# Patient Record
Sex: Male | Born: 1950 | Race: White | Hispanic: No | Marital: Married | Smoking: Former smoker
Health system: Southern US, Community
[De-identification: ages and names within clinical notes are randomized; demographics above are authoritative.]

## PROBLEM LIST (undated history)

## (undated) DIAGNOSIS — N529 Male erectile dysfunction, unspecified: Secondary | ICD-10-CM

## (undated) DIAGNOSIS — H409 Unspecified glaucoma: Secondary | ICD-10-CM

## (undated) DIAGNOSIS — I2510 Atherosclerotic heart disease of native coronary artery without angina pectoris: Secondary | ICD-10-CM

## (undated) DIAGNOSIS — H539 Unspecified visual disturbance: Secondary | ICD-10-CM

## (undated) DIAGNOSIS — G4700 Insomnia, unspecified: Secondary | ICD-10-CM

## (undated) DIAGNOSIS — K219 Gastro-esophageal reflux disease without esophagitis: Secondary | ICD-10-CM

## (undated) DIAGNOSIS — E119 Type 2 diabetes mellitus without complications: Secondary | ICD-10-CM

## (undated) DIAGNOSIS — J309 Allergic rhinitis, unspecified: Secondary | ICD-10-CM

## (undated) DIAGNOSIS — I10 Essential (primary) hypertension: Secondary | ICD-10-CM

## (undated) DIAGNOSIS — J45909 Unspecified asthma, uncomplicated: Secondary | ICD-10-CM

## (undated) DIAGNOSIS — F419 Anxiety disorder, unspecified: Secondary | ICD-10-CM

## (undated) DIAGNOSIS — K635 Polyp of colon: Secondary | ICD-10-CM

## (undated) DIAGNOSIS — I219 Acute myocardial infarction, unspecified: Secondary | ICD-10-CM

## (undated) DIAGNOSIS — D126 Benign neoplasm of colon, unspecified: Secondary | ICD-10-CM

## (undated) DIAGNOSIS — E785 Hyperlipidemia, unspecified: Secondary | ICD-10-CM

## (undated) HISTORY — PX: CARDIAC SURGERY: SHX584

## (undated) HISTORY — PX: CORONARY ARTERY BYPASS GRAFT: SHX141

## (undated) HISTORY — PX: CARDIAC CATHETERIZATION: SHX172

## (undated) HISTORY — PX: CORONARY ANGIOPLASTY: SHX604

---

## 2015-10-31 ENCOUNTER — Ambulatory Visit: Payer: Medicare Other

## 2015-10-31 ENCOUNTER — Ambulatory Visit
Admission: EM | Admit: 2015-10-31 | Discharge: 2015-10-31 | Disposition: A | Discharge status: Home / Self Care | Payer: Medicare Other | Attending: Family Medicine | Admitting: Family Medicine

## 2015-10-31 ENCOUNTER — Encounter: Payer: Self-pay | Admitting: Emergency Medicine

## 2015-10-31 DIAGNOSIS — J029 Acute pharyngitis, unspecified: Secondary | ICD-10-CM | POA: Insufficient documentation

## 2015-10-31 DIAGNOSIS — R05 Cough: Secondary | ICD-10-CM | POA: Diagnosis present

## 2015-10-31 DIAGNOSIS — Z951 Presence of aortocoronary bypass graft: Secondary | ICD-10-CM | POA: Diagnosis not present

## 2015-10-31 DIAGNOSIS — J40 Bronchitis, not specified as acute or chronic: Secondary | ICD-10-CM

## 2015-10-31 HISTORY — DX: Type 2 diabetes mellitus without complications: E11.9

## 2015-10-31 HISTORY — DX: Acute myocardial infarction, unspecified: I21.9

## 2015-10-31 MED ORDER — AZITHROMYCIN 250 MG PO TABS: ORAL_TABLET | ORAL | Status: DC

## 2015-10-31 MED ORDER — HYDROCOD POLST-CPM POLST ER 10-8 MG/5ML PO SUER: 5.0000 mL | Freq: Two times a day (BID) | ORAL | Status: DC | PRN

## 2015-10-31 NOTE — ED Notes (Signed)
Pt reports moved here from [place] about 1 month ago and about 1 week ago started having sore throat and productive cough. Pt reports chills and had fever Saturday but denies any since

## 2015-10-31 NOTE — ED Provider Notes (Signed)
CSN: [id number]     Arrival date & time 10/31/15  [id number] History   First MD Initiated Contact with Patient 10/31/15 1218      .Nurses notes were reviewed. Chief Complaint  Patient presents with  . Sore Throat    Patient reports doing sick Thursday probable sore throat persistent cough which started on Friday. He states cough is gotten worse the sore throats got better. Occasionally he does cough up something green but the cough. Much nonproductive he has no some proximal spasm but only after prolonged coughing.  He has history of diabetes myocardial infarction and cardiac surgery. Stop smoking over 35 years ago and even then was a very light smoker before he stop smoking    (Consider location/radiation/quality/duration/timing/severity/associated sxs/prior Treatment) Patient is a 64 y.o. male presenting with pharyngitis and cough. The history is provided by the patient. No language interpreter was used.  Sore Throat This is a new problem. The current episode started more than 2 days ago. The problem occurs constantly. The problem has been gradually improving. Pertinent negatives include no chest pain, no abdominal pain, no headaches and no shortness of breath. Nothing aggravates the symptoms. Nothing relieves the symptoms. He has tried nothing for the symptoms.  Cough Cough characteristics:  Productive and non-productive Sputum characteristics:  Green Severity:  Moderate Onset quality:  Sudden Timing:  Sporadic Progression:  Worsening Chronicity:  New Context: smoke exposure and upper respiratory infection   Context: not animal exposure, not exposure to allergens, not fumes, not occupational exposure and not sick contacts   Relieved by:  Nothing Ineffective treatments:  None tried Associated symptoms: no chest pain, no ear fullness, no ear pain, no headaches, no rash, no rhinorrhea, no shortness of breath and no sinus congestion   Risk factors: no chemical exposure, no recent infection  and no recent travel     Past Medical History  Diagnosis Date  . Diabetes mellitus without complication ([place])   . Myocardial infarct [facility])    Past Surgical History  Procedure Laterality Date  . Cardiac surgery     History reviewed. No pertinent family history. Social History  Substance Use Topics  . Smoking status: Never Smoker   . Smokeless tobacco: None  . Alcohol Use: Yes     Comment: occasional     Review of Systems  HENT: Negative for ear pain and rhinorrhea.   Respiratory: Positive for cough. Negative for shortness of breath.   Cardiovascular: Negative for chest pain.  Gastrointestinal: Negative for abdominal pain.  Skin: Negative for rash.  Neurological: Negative for headaches.  All other systems reviewed and are negative.   Allergies  Review of patient's allergies indicates no known allergies.  Home Medications   Prior to Admission medications   Medication Sig Start Date End Date Taking? Authorizing Provider  atorvastatin (LIPITOR) 80 MG tablet Take 80 mg by mouth daily.   Yes Historical Provider, MD  carvedilol (COREG) 12.5 MG tablet Take 12.5 mg by mouth 2 (two) times daily with a meal.   Yes Historical Provider, MD  clopidogrel (PLAVIX) 75 MG tablet Take 75 mg by mouth daily.   Yes Historical Provider, MD  glipiZIDE (GLUCOTROL) 10 MG tablet Take 10 mg by mouth daily before breakfast.   Yes Historical Provider, MD  lisinopril (PRINIVIL,ZESTRIL) 10 MG tablet Take 10 mg by mouth daily.   Yes Historical Provider, MD  metFORMIN (GLUMETZA) 1000 MG (MOD) 24 hr tablet Take 1,000 mg by mouth 2 (two) times daily with a meal.  Yes Historical Provider, MD  nitroGLYCERIN (NITROSTAT) 0.4 MG SL tablet Place 0.4 mg under the tongue every 5 (five) minutes as needed for chest pain.   Yes Historical Provider, MD  pantoprazole (PROTONIX) 40 MG tablet Take 40 mg by mouth daily.   Yes Historical Provider, MD   Meds Ordered and Administered this Visit  Medications - No data to  display  BP 132/89 mmHg  Pulse 83  Temp(Src) 98.4 F (36.9 C)  Resp 17  Ht 5\' 6"  (1.676 m)  Wt 194 lb (87.998 kg)  BMI 31.33 kg/m2  SpO2 96% No data found.   Physical Exam  Constitutional: He is oriented to person, place, and time. He appears well-developed and well-nourished.  HENT:  Head: Normocephalic and atraumatic.  Eyes: Conjunctivae are normal. Pupils are equal, round, and reactive to light.  Neck: Normal range of motion. Neck supple. No tracheal deviation present. No thyromegaly present.  Cardiovascular: Normal rate, regular rhythm and normal heart sounds.   Pulmonary/Chest: Effort normal and breath sounds normal. No respiratory distress. He has no wheezes.  Musculoskeletal: Normal range of motion. He exhibits edema.  Lymphadenopathy:    He has no cervical adenopathy.  Neurological: He is alert and oriented to person, place, and time. He has normal reflexes.  Skin: Skin is warm and dry. No erythema.  Psychiatric: He has a normal mood and affect. His behavior is normal.  Vitals reviewed.     ED Course  Procedures (including critical care time)  Labs Review Labs Reviewed - No data to display  Imaging Review Dg Chest 2 View  10/31/2015  CLINICAL DATA:  Productive cough with low-grade fever, congestion, sore throat, fatigue, chills for 5 days. EXAM: CHEST  2 VIEW COMPARISON:  None. FINDINGS: Prior CABG. Heart and mediastinal contours are within normal limits. No focal opacities or effusions. No acute bony abnormality. IMPRESSION: No active cardiopulmonary disease. Electronically Signed   By: Rolm Baptise M.D.   On: 10/31/2015 12:31     Visual Acuity Review  Right Eye Distance:   Left Eye Distance:   Bilateral Distance:    Right Eye Near:   Left Eye Near:    Bilateral Near:        MDM   1. Bronchitis     We'll place on Tussionex 1 teaspoon twice a day. Radiologist agrees is no signs of any infiltrates on chest x-ray will go Z-Pak. Return for follow-up  as needed. Patient declined need for an inhaler at this time.   Frederich Cha, MD 10/31/15 [phone]

## 2015-10-31 NOTE — Discharge Instructions (Signed)
Upper Respiratory Infection, Adult Most upper respiratory infections (URIs) are caused by a virus. A URI affects the nose, throat, and upper air passages. The most common type of URI is often called "the common cold." HOME CARE   Take medicines only as told by your doctor.  Gargle warm saltwater or take cough drops to comfort your throat as told by your doctor.  Use a warm mist humidifier or inhale steam from a shower to increase air moisture. This may make it easier to breathe.  Drink enough fluid to keep your pee (urine) clear or pale yellow.  Eat soups and other clear broths.  Have a healthy diet.  Rest as needed.  Go back to work when your fever is gone or your doctor says it is okay.  You may need to stay home longer to avoid giving your URI to others.  You can also wear a face mask and wash your hands often to prevent spread of the virus.  Use your inhaler more if you have asthma.  Do not use any tobacco products, including cigarettes, chewing tobacco, or electronic cigarettes. If you need help quitting, ask your doctor. GET HELP IF:  You are getting worse, not better.  Your symptoms are not helped by medicine.  You have chills.  You are getting more short of breath.  You have brown or red mucus.  You have yellow or brown discharge from your nose.  You have pain in your face, especially when you bend forward.  You have a fever.  You have puffy (swollen) neck glands.  You have pain while swallowing.  You have white areas in the back of your throat. GET HELP RIGHT AWAY IF:   You have very bad or constant:  Headache.  Ear pain.  Pain in your forehead, behind your eyes, and over your cheekbones (sinus pain).  Chest pain.  You have long-lasting (chronic) lung disease and any of the following:  Wheezing.  Long-lasting cough.  Coughing up blood.  A change in your usual mucus.  You have a stiff neck.  You have changes in  your:  Vision.  Hearing.  Thinking.  Mood. MAKE SURE YOU:   Understand these instructions.  Will watch your condition.  Will get help right away if you are not doing well or get worse.   This information is not intended to replace advice given to you by your health care provider. Make sure you discuss any questions you have with your health care provider.   Document Released: 04/30/2008 Document Revised: 03/29/2015 Document Reviewed: 02/17/2014 Elsevier Interactive Patient Education 2016 [employer] Acute Bronchitis Bronchitis is inflammation of the airways that extend from the windpipe into the lungs (bronchi). The inflammation often causes mucus to develop. This leads to a cough, which is the most common symptom of bronchitis.  In acute bronchitis, the condition usually develops suddenly and goes away over time, usually in a couple weeks. Smoking, allergies, and asthma can make bronchitis worse. Repeated episodes of bronchitis may cause further lung problems.  CAUSES Acute bronchitis is most often caused by the same virus that causes a cold. The virus can spread from person to person (contagious) through coughing, sneezing, and touching contaminated objects. SIGNS AND SYMPTOMS   Cough.   Fever.   Coughing up mucus.   Body aches.   Chest congestion.   Chills.   Shortness of breath.   Sore throat.  DIAGNOSIS  Acute bronchitis is usually diagnosed through a physical exam. Your  health care provider will also ask you questions about your medical history. Tests, such as chest X-rays, are sometimes done to rule out other conditions.  TREATMENT  Acute bronchitis usually goes away in a couple weeks. Oftentimes, no medical treatment is necessary. Medicines are sometimes given for relief of fever or cough. Antibiotic medicines are usually not needed but may be prescribed in certain situations. In some cases, an inhaler may be recommended to help reduce shortness of  breath and control the cough. A cool mist vaporizer may also be used to help thin bronchial secretions and make it easier to clear the chest.  HOME CARE INSTRUCTIONS  Get plenty of rest.   Drink enough fluids to keep your urine clear or pale yellow (unless you have a medical condition that requires fluid restriction). Increasing fluids may help thin your respiratory secretions (sputum) and reduce chest congestion, and it will prevent dehydration.   Take medicines only as directed by your health care provider.  If you were prescribed an antibiotic medicine, finish it all even if you start to feel better.  Avoid smoking and secondhand smoke. Exposure to cigarette smoke or irritating chemicals will make bronchitis worse. If you are a smoker, consider using nicotine gum or skin patches to help control withdrawal symptoms. Quitting smoking will help your lungs heal faster.   Reduce the chances of another bout of acute bronchitis by washing your hands frequently, avoiding people with cold symptoms, and trying not to touch your hands to your mouth, nose, or eyes.   Keep all follow-up visits as directed by your health care provider.  SEEK MEDICAL CARE IF: Your symptoms do not improve after 1 week of treatment.  SEEK IMMEDIATE MEDICAL CARE IF:  You develop an increased fever or chills.   You have chest pain.   You have severe shortness of breath.  You have bloody sputum.   You develop dehydration.  You faint or repeatedly feel like you are going to pass out.  You develop repeated vomiting.  You develop a severe headache. MAKE SURE YOU:   Understand these instructions.  Will watch your condition.  Will get help right away if you are not doing well or get worse.   This information is not intended to replace advice given to you by your health care provider. Make sure you discuss any questions you have with your health care provider.   Document Released: 12/20/2004 Document  Revised: 12/03/2014 Document Reviewed: 05/05/2013 Elsevier Interactive Patient Education [employer].

## 2016-01-25 DIAGNOSIS — E1159 Type 2 diabetes mellitus with other circulatory complications: Secondary | ICD-10-CM | POA: Insufficient documentation

## 2016-01-25 DIAGNOSIS — I10 Essential (primary) hypertension: Secondary | ICD-10-CM | POA: Insufficient documentation

## 2016-01-25 DIAGNOSIS — I25119 Atherosclerotic heart disease of native coronary artery with unspecified angina pectoris: Secondary | ICD-10-CM | POA: Insufficient documentation

## 2016-01-25 DIAGNOSIS — K2100 Gastro-esophageal reflux disease with esophagitis, without bleeding: Secondary | ICD-10-CM | POA: Insufficient documentation

## 2016-01-25 DIAGNOSIS — G4700 Insomnia, unspecified: Secondary | ICD-10-CM | POA: Insufficient documentation

## 2016-02-01 DIAGNOSIS — E782 Mixed hyperlipidemia: Secondary | ICD-10-CM | POA: Insufficient documentation

## 2016-04-27 DIAGNOSIS — N521 Erectile dysfunction due to diseases classified elsewhere: Secondary | ICD-10-CM | POA: Insufficient documentation

## 2016-08-06 DIAGNOSIS — H539 Unspecified visual disturbance: Secondary | ICD-10-CM | POA: Insufficient documentation

## 2016-12-13 ENCOUNTER — Ambulatory Visit: Admission: RE | Admit: 2016-12-13 | Payer: Medicare Other | Source: Ambulatory Visit | Admitting: Gastroenterology

## 2016-12-13 ENCOUNTER — Encounter: Admission: RE | Payer: Self-pay | Source: Ambulatory Visit

## 2016-12-13 SURGERY — COLONOSCOPY WITH PROPOFOL
Anesthesia: General

## 2017-01-10 ENCOUNTER — Encounter: Payer: Self-pay | Admitting: *Deleted

## 2017-01-11 ENCOUNTER — Encounter: Payer: Self-pay | Admitting: Anesthesiology

## 2017-01-11 ENCOUNTER — Encounter
Admission: RE | Disposition: A | Discharge status: Home / Self Care | Payer: Self-pay | Source: Ambulatory Visit | Attending: Gastroenterology

## 2017-01-11 ENCOUNTER — Ambulatory Visit
Admission: RE | Admit: 2017-01-11 | Discharge: 2017-01-11 | Disposition: A | Discharge status: Home / Self Care | Payer: Medicare Other | Source: Ambulatory Visit | Attending: Gastroenterology | Admitting: Gastroenterology

## 2017-01-11 ENCOUNTER — Ambulatory Visit: Payer: Medicare Other | Admitting: Anesthesiology

## 2017-01-11 DIAGNOSIS — I252 Old myocardial infarction: Secondary | ICD-10-CM | POA: Insufficient documentation

## 2017-01-11 DIAGNOSIS — Z7982 Long term (current) use of aspirin: Secondary | ICD-10-CM | POA: Insufficient documentation

## 2017-01-11 DIAGNOSIS — Z7902 Long term (current) use of antithrombotics/antiplatelets: Secondary | ICD-10-CM | POA: Insufficient documentation

## 2017-01-11 DIAGNOSIS — D125 Benign neoplasm of sigmoid colon: Secondary | ICD-10-CM | POA: Diagnosis not present

## 2017-01-11 DIAGNOSIS — Z87891 Personal history of nicotine dependence: Secondary | ICD-10-CM | POA: Diagnosis not present

## 2017-01-11 DIAGNOSIS — D123 Benign neoplasm of transverse colon: Secondary | ICD-10-CM | POA: Insufficient documentation

## 2017-01-11 DIAGNOSIS — D128 Benign neoplasm of rectum: Secondary | ICD-10-CM | POA: Insufficient documentation

## 2017-01-11 DIAGNOSIS — Z7984 Long term (current) use of oral hypoglycemic drugs: Secondary | ICD-10-CM | POA: Diagnosis not present

## 2017-01-11 DIAGNOSIS — Z1211 Encounter for screening for malignant neoplasm of colon: Secondary | ICD-10-CM | POA: Insufficient documentation

## 2017-01-11 DIAGNOSIS — E785 Hyperlipidemia, unspecified: Secondary | ICD-10-CM | POA: Insufficient documentation

## 2017-01-11 DIAGNOSIS — I10 Essential (primary) hypertension: Secondary | ICD-10-CM | POA: Insufficient documentation

## 2017-01-11 DIAGNOSIS — D124 Benign neoplasm of descending colon: Secondary | ICD-10-CM | POA: Insufficient documentation

## 2017-01-11 DIAGNOSIS — G4700 Insomnia, unspecified: Secondary | ICD-10-CM | POA: Insufficient documentation

## 2017-01-11 DIAGNOSIS — H409 Unspecified glaucoma: Secondary | ICD-10-CM | POA: Diagnosis not present

## 2017-01-11 DIAGNOSIS — Z79899 Other long term (current) drug therapy: Secondary | ICD-10-CM | POA: Insufficient documentation

## 2017-01-11 DIAGNOSIS — K219 Gastro-esophageal reflux disease without esophagitis: Secondary | ICD-10-CM | POA: Insufficient documentation

## 2017-01-11 DIAGNOSIS — K5730 Diverticulosis of large intestine without perforation or abscess without bleeding: Secondary | ICD-10-CM | POA: Insufficient documentation

## 2017-01-11 DIAGNOSIS — E119 Type 2 diabetes mellitus without complications: Secondary | ICD-10-CM | POA: Diagnosis not present

## 2017-01-11 DIAGNOSIS — I2510 Atherosclerotic heart disease of native coronary artery without angina pectoris: Secondary | ICD-10-CM | POA: Diagnosis not present

## 2017-01-11 DIAGNOSIS — F419 Anxiety disorder, unspecified: Secondary | ICD-10-CM | POA: Diagnosis not present

## 2017-01-11 HISTORY — PX: COLONOSCOPY WITH PROPOFOL: SHX5780

## 2017-01-11 HISTORY — DX: Anxiety disorder, unspecified: F41.9

## 2017-01-11 HISTORY — DX: Insomnia, unspecified: G47.00

## 2017-01-11 HISTORY — DX: Essential (primary) hypertension: I10

## 2017-01-11 HISTORY — DX: Hyperlipidemia, unspecified: E78.5

## 2017-01-11 HISTORY — DX: Atherosclerotic heart disease of native coronary artery without angina pectoris: I25.10

## 2017-01-11 HISTORY — DX: Male erectile dysfunction, unspecified: N52.9

## 2017-01-11 HISTORY — DX: Unspecified glaucoma: H40.9

## 2017-01-11 HISTORY — DX: Gastro-esophageal reflux disease without esophagitis: K21.9

## 2017-01-11 LAB — GLUCOSE, CAPILLARY: Glucose-Capillary: 108 mg/dL — ABNORMAL HIGH (ref 65–99)

## 2017-01-11 SURGERY — COLONOSCOPY WITH PROPOFOL
Anesthesia: General

## 2017-01-11 MED ORDER — PHENYLEPHRINE HCL 10 MG/ML IJ SOLN
INTRAMUSCULAR | Status: AC
Start: 2017-01-11 — End: 2017-01-11
  Filled 2017-01-11: qty 1

## 2017-01-11 MED ORDER — PROPOFOL 10 MG/ML IV BOLUS
INTRAVENOUS | Status: DC | PRN
  Administered 2017-01-11: 30 mg via INTRAVENOUS
  Administered 2017-01-11: 90 mg via INTRAVENOUS

## 2017-01-11 MED ORDER — PHENYLEPHRINE 40 MCG/ML (10ML) SYRINGE FOR IV PUSH (FOR BLOOD PRESSURE SUPPORT)
PREFILLED_SYRINGE | INTRAVENOUS | Status: DC | PRN
  Administered 2017-01-11: 100 ug via INTRAVENOUS

## 2017-01-11 MED ORDER — PROPOFOL 500 MG/50ML IV EMUL
INTRAVENOUS | Status: DC | PRN
Start: 2017-01-11 — End: 2017-01-11
  Administered 2017-01-11: 120 ug/kg/min via INTRAVENOUS

## 2017-01-11 MED ORDER — SODIUM CHLORIDE 0.9 % IV SOLN
INTRAVENOUS | Status: DC
  Administered 2017-01-11: 1000 mL via INTRAVENOUS

## 2017-01-11 MED ORDER — SODIUM CHLORIDE 0.9 % IV SOLN
INTRAVENOUS | Status: DC
  Administered 2017-01-11: 08:00:00 via INTRAVENOUS

## 2017-01-11 MED ORDER — PROPOFOL 500 MG/50ML IV EMUL
INTRAVENOUS | Status: AC
  Filled 2017-01-11: qty 50

## 2017-01-11 MED ORDER — PROPOFOL 500 MG/50ML IV EMUL
INTRAVENOUS | Status: AC
Start: 2017-01-11 — End: 2017-01-11
  Filled 2017-01-11: qty 50

## 2017-01-11 NOTE — Transfer of Care (Signed)
Immediate Anesthesia Transfer of Care Note  Patient: Peter Morales  Procedure(s) Performed: Procedure(s): COLONOSCOPY WITH PROPOFOL (N/A)  Patient Location: PACU  Anesthesia Type:General  Level of Consciousness: sedated  Airway & Oxygen Therapy: Patient Spontanous Breathing and Patient connected to nasal cannula oxygen  Post-op Assessment: Report given to RN and Post -op Vital signs reviewed and stable  Post vital signs: Reviewed and stable  Last Vitals:  Vitals:   01/11/17 0710  BP: (!) 164/78  Pulse: 79  Resp: 16  Temp: 36.3 C    Last Pain:  Vitals:   01/11/17 0710  TempSrc: Tympanic         Complications: No apparent anesthesia complications

## 2017-01-11 NOTE — Anesthesia Post-op Follow-up Note (Signed)
Anesthesia QCDR form completed.        

## 2017-01-11 NOTE — Anesthesia Postprocedure Evaluation (Signed)
Anesthesia Post Note  Patient: Peter Morales  Procedure(s) Performed: Procedure(s) (LRB): COLONOSCOPY WITH PROPOFOL (N/A)  Patient location during evaluation: Endoscopy Anesthesia Type: General Level of consciousness: awake and alert Pain management: pain level controlled Vital Signs Assessment: post-procedure vital signs reviewed and stable Respiratory status: spontaneous breathing, nonlabored ventilation, respiratory function stable and patient connected to nasal cannula oxygen Cardiovascular status: blood pressure returned to baseline and stable Postop Assessment: no signs of nausea or vomiting Anesthetic complications: no     Last Vitals:  Vitals:   01/11/17 0910 01/11/17 0920  BP: (!) 146/82 (!) 146/82  Pulse:  75  Resp:  17  Temp:      Last Pain:  Vitals:   01/11/17 0850  TempSrc: Tympanic                 Precious Haws Ceaser Ebeling

## 2017-01-11 NOTE — Anesthesia Preprocedure Evaluation (Signed)
Anesthesia Evaluation  Patient identified by MRN, date of birth, ID band Patient awake    Reviewed: Allergy & Precautions, H&P , NPO status , Patient's Chart, lab work & pertinent test results  Airway Mallampati: III  TM Distance: >3 FB Neck ROM: limited    Dental  (+) Poor Dentition, Chipped, Missing   Pulmonary neg pulmonary ROS, neg shortness of breath, former smoker,    Pulmonary exam normal breath sounds clear to auscultation       Cardiovascular Exercise Tolerance: Good hypertension, (-) angina+ CAD, + Past MI, + Cardiac Stents and + CABG  Normal cardiovascular exam Rhythm:regular Rate:Normal     Neuro/Psych PSYCHIATRIC DISORDERS Anxiety negative neurological ROS     GI/Hepatic Neg liver ROS, GERD  Controlled,  Endo/Other  diabetes, Type 2  Renal/GU negative Renal ROS  negative genitourinary   Musculoskeletal   Abdominal   Peds  Hematology negative hematology ROS (+)   Anesthesia Other Findings Past Medical History: No date: Anxiety No date: Coronary artery disease No date: Diabetes mellitus without complication ([facility]) No date: Erectile dysfunction No date: GERD (gastroesophageal reflux disease) No date: Glaucoma No date: Hyperlipidemia No date: Hypertension No date: Insomnia No date: Myocardial infarct  Past Surgical History: No date: CARDIAC CATHETERIZATION No date: CARDIAC SURGERY No date: CORONARY ARTERY BYPASS GRAFT     Reproductive/Obstetrics negative OB ROS                             Anesthesia Physical Anesthesia Plan  ASA: III  Anesthesia Plan: General   Post-op Pain Management:    Induction:   Airway Management Planned:   Additional Equipment:   Intra-op Plan:   Post-operative Plan:   Informed Consent: I have reviewed the patients History and Physical, chart, labs and discussed the procedure including the risks, benefits and alternatives for the  proposed anesthesia with the patient or authorized representative who has indicated his/her understanding and acceptance.   Dental Advisory Given  Plan Discussed with: Anesthesiologist, CRNA and Surgeon  Anesthesia Plan Comments:         Anesthesia Quick Evaluation

## 2017-01-11 NOTE — H&P (Addendum)
Outpatient short stay form Pre-procedure 01/11/2017 7:27 AM Peter Sails MD  Primary Physician: Dr. Janene Harvey  Reason for visit:  Colonoscopy  History of present illness:  Patient is a 66 year old male presenting today as above. He tolerated his prep well. He takes both Plavix and aspirin due to his cardiac history however has not taken though since last Saturday that being now 6 days.    Current Facility-Administered Medications:  .  0.9 %  sodium chloride infusion, , Intravenous, Continuous, Peter Sails, MD .  0.9 %  sodium chloride infusion, , Intravenous, Continuous, Peter Sails, MD  Prescriptions Prior to Admission  Medication Sig Dispense Refill Last Dose  . albuterol (PROVENTIL HFA;VENTOLIN HFA) 108 (90 Base) MCG/ACT inhaler Inhale 2 puffs into the lungs every 6 (six) hours as needed for wheezing or shortness of breath.     Marland Kitchen aspirin EC 81 MG tablet Take 81 mg by mouth daily.     Marland Kitchen azelastine (ASTELIN) 0.1 % nasal spray Place 1 spray into both nostrils 2 (two) times daily. Use in each nostril as directed     . azelastine (OPTIVAR) 0.05 % ophthalmic solution Place into both eyes 2 (two) times daily.     . carvedilol (COREG) 12.5 MG tablet Take 12.5 mg by mouth 2 (two) times daily with a meal.   01/09/2017 at 0800  . cetirizine (ZYRTEC) 10 MG tablet Take 10 mg by mouth daily.     . clopidogrel (PLAVIX) 75 MG tablet Take 75 mg by mouth daily.   01/05/2017 at Unknown time  . cyclobenzaprine (FLEXERIL) 5 MG tablet Take 5 mg by mouth 3 (three) times daily as needed for muscle spasms.     . Diphenhydramine-APAP 25-500 MG TABS Take 500 mg by mouth every evening.     . fenofibrate (TRICOR) 145 MG tablet Take 145 mg by mouth daily.     Marland Kitchen LORazepam (ATIVAN) 0.5 MG tablet Take 0.5 mg by mouth every 8 (eight) hours. takr 1/2 tab as needed     . [DISCONTINUED] aspirin 325 MG EC tablet Take 325 mg by mouth daily.     Marland Kitchen atorvastatin (LIPITOR) 80 MG tablet Take 80 mg by mouth daily.      Marland Kitchen glipiZIDE (GLUCOTROL) 10 MG tablet Take 10 mg by mouth daily before breakfast.     . lisinopril (PRINIVIL,ZESTRIL) 10 MG tablet Take 10 mg by mouth daily.     . metFORMIN (GLUMETZA) 1000 MG (MOD) 24 hr tablet Take 1,000 mg by mouth 2 (two) times daily with a meal.     . nitroGLYCERIN (NITROSTAT) 0.4 MG SL tablet Place 0.4 mg under the tongue every 5 (five) minutes as needed for chest pain.     . pantoprazole (PROTONIX) 40 MG tablet Take 40 mg by mouth daily.     . [DISCONTINUED] azithromycin (ZITHROMAX Z-PAK) 250 MG tablet Take 2 tablets first day and then 1 po a day for 4 days 6 tablet 0   . [DISCONTINUED] chlorpheniramine-HYDROcodone (TUSSIONEX PENNKINETIC ER) 10-8 MG/5ML SUER Take 5 mLs by mouth every 12 (twelve) hours as needed for cough. 115 mL 0      No Known Allergies   Past Medical History:  Diagnosis Date  . Anxiety   . Coronary artery disease   . Diabetes mellitus without complication ([place])   . Erectile dysfunction   . GERD (gastroesophageal reflux disease)   . Glaucoma   . Hyperlipidemia   . Hypertension   . Insomnia   .  Myocardial infarct     Review of systems:      Physical Exam    Heart and lungs: Regular rate and rhythm without rub or gallop, lungs are bilaterally clear.    HEENT: Normocephalic atraumatic eyes are anicteric    Other:     Pertinant exam for procedure: Soft nontender nondistended bowel sounds positive normoactive    Planned proceedures: Colonoscopy and indicated procedures. I have discussed the risks benefits and complications of procedures to include not limited to bleeding, infection, perforation and the risk of sedation and the patient wishes to proceed.    Peter Sails, MD Gastroenterology 01/11/2017  7:27 AM

## 2017-01-11 NOTE — Op Note (Signed)
[facility] Gastroenterology Patient Name: Peter Morales Procedure Date: 01/11/2017 7:26 AM MRN: [id number] Account #: [id number] Date of Birth: 1951-09-02 Admit Type: Outpatient Age: 66 Room: [facility] ENDO ROOM 3 Gender: Male Note Status: Finalized Procedure:            Colonoscopy Indications:          Screening for colorectal malignant neoplasm Providers:            Lollie Sails, MD Medicines:            Monitored Anesthesia Care Complications:        No immediate complications. Procedure:            Pre-Anesthesia Assessment:                       - ASA Grade Assessment: III - A patient with severe                        systemic disease.                       After obtaining informed consent, the colonoscope was                        passed under direct vision. Throughout the procedure,                        the patient's blood pressure, pulse, and oxygen                        saturations were monitored continuously. The                        Colonoscope was introduced through the anus and                        advanced to the the cecum, identified by appendiceal                        orifice and ileocecal valve. The colonoscopy was                        performed with moderate difficulty due to significant                        looping and a tortuous colon. Successful completion of                        the procedure was aided by changing the patient to a                        supine position, changing the patient to a prone                        position and using manual pressure. The patient                        tolerated the procedure well. The quality of the bowel                        preparation was fair.  Findings:      Two sessile polyps were found in the rectum. The polyps were 2 to 3 mm       in size. These polyps were removed with a cold biopsy forceps. Resection       and retrieval were complete.      A 8 mm polyp was found in  the distal sigmoid colon. The polyp was       semi-pedunculated. The polyp was removed with a cold snare. Resection       and retrieval were complete. To prevent bleeding after the polypectomy,       one hemostatic clip was successfully placed. There was no bleeding at       the end of the maneuver.      A 5 mm polyp was found in the distal descending colon. The polyp was       sessile. The polyp was removed with a cold snare. Resection and       retrieval were complete.      A 5 mm polyp was found in the transverse colon. The polyp was sessile.       The polyp was removed with a cold snare. Resection and retrieval were       complete.      Two sessile polyps were found in the distal transverse colon. The polyps       were 2 to 3 mm in size. These polyps were removed with a cold biopsy       forceps. Resection and retrieval were complete.      A 3 mm polyp was found in the splenic flexure. The polyp was sessile.       The polyp was removed with a cold biopsy forceps. Resection and       retrieval were complete. To prevent bleeding after the polypectomy, one       hemostatic clip was successfully placed. There was no bleeding at the       end of the maneuver.      A few small-mouthed diverticula were found in the sigmoid colon,       descending colon and distal descending colon.      The digital rectal exam was normal. Impression:           - Preparation of the colon was fair.                       - Two 2 to 3 mm polyps in the rectum, removed with a                        cold biopsy forceps. Resected and retrieved.                       - One 8 mm polyp in the distal sigmoid colon, removed                        with a cold snare. Resected and retrieved. Clip was                        placed.                       - One 5 mm polyp in the distal descending colon,  removed with a cold snare. Resected and retrieved.                       - One 5 mm polyp in the  transverse colon, removed with                        a cold snare. Resected and retrieved.                       - Two 2 to 3 mm polyps in the distal transverse colon,                        removed with a cold biopsy forceps. Resected and                        retrieved.                       - One 3 mm polyp at the splenic flexure, removed with a                        cold biopsy forceps. Resected and retrieved. Clip was                        placed.                       - Diverticulosis in the sigmoid colon, in the                        descending colon and in the distal descending colon. Recommendation:       - Discharge patient to home.                       - Full liquid diet today, then advance as tolerated to                        low residue diet for 3 days.                       - Telephone GI clinic for pathology results in 1 week. Procedure Code(s):    --- Professional ---                       [phone], Colonoscopy, flexible; with removal of tumor(s),                        polyp(s), or other lesion(s) by snare technique                       [id number], 77, Colonoscopy, flexible; with biopsy, single                        or multiple Diagnosis Code(s):    --- Professional ---                       Z12.11, Encounter for screening for malignant neoplasm                        of colon  K62.1, Rectal polyp                       D12.3, Benign neoplasm of transverse colon (hepatic                        flexure or splenic flexure)                       D12.5, Benign neoplasm of sigmoid colon                       D12.4, Benign neoplasm of descending colon                       K57.30, Diverticulosis of large intestine without                        perforation or abscess without bleeding CPT copyright 2016 [employer]. All rights reserved. The codes documented in this report are preliminary and upon coder review may  be revised to meet  current compliance requirements. Lollie Sails, MD 01/11/2017 8:58:49 AM This report has been signed electronically. Number of Addenda: 0 Note Initiated On: 01/11/2017 7:26 AM Scope Withdrawal Time: 0 hours 41 minutes 19 seconds  Total Procedure Duration: 1 hour 9 minutes 18 seconds       [facility]

## 2017-01-14 ENCOUNTER — Encounter: Payer: Self-pay | Admitting: Gastroenterology

## 2017-01-14 LAB — SURGICAL PATHOLOGY

## 2017-09-09 DIAGNOSIS — J45998 Other asthma: Secondary | ICD-10-CM | POA: Insufficient documentation

## 2017-09-09 DIAGNOSIS — J309 Allergic rhinitis, unspecified: Secondary | ICD-10-CM | POA: Insufficient documentation

## 2017-11-06 DIAGNOSIS — I214 Non-ST elevation (NSTEMI) myocardial infarction: Secondary | ICD-10-CM | POA: Insufficient documentation

## 2017-11-06 DIAGNOSIS — I200 Unstable angina: Secondary | ICD-10-CM | POA: Insufficient documentation

## 2017-12-05 ENCOUNTER — Encounter: Payer: Medicare Other | Attending: Internal Medicine | Admitting: *Deleted

## 2017-12-05 ENCOUNTER — Encounter: Payer: Self-pay | Admitting: *Deleted

## 2017-12-05 VITALS — Ht 66.0 in | Wt 198.5 lb

## 2017-12-05 DIAGNOSIS — I214 Non-ST elevation (NSTEMI) myocardial infarction: Secondary | ICD-10-CM | POA: Insufficient documentation

## 2017-12-05 NOTE — Progress Notes (Signed)
Daily Session Note  Patient Details  Name: Peter Morales MRN: [id number] Date of Birth: 08-05-1951 Referring Provider:     Cardiac Rehab from 12/05/2017 in [facility] Cardiac [facility] Pulmonary [facility]hab  Referring Provider  Tobe Sos      Encounter Date: 12/05/2017  Check In: Session Check In - 12/05/17 1202      Check-In   Location  [facility]-Cardiac & Pulmonary Rehab    Staff Present  Renita Papa, RN Vickki Hearing, BA, ACSM CEP, Exercise Physiologist;Susanne Bice, RN, BSN, CCRP    Supervising physician immediately available to respond to emergencies  See telemetry face sheet for immediately available ER MD    Warm-up and Cool-down  Performed as group-led instruction    Resistance Training Performed  Yes        Exercise Prescription Changes - 12/05/17 1400      Response to Exercise   Blood Pressure (Admit)  118/62    Blood Pressure (Exercise)  150/64    Blood Pressure (Exit)  130/64    Heart Rate (Admit)  80 bpm    Heart Rate (Exercise)  119 bpm    Heart Rate (Exit)  81 bpm    Oxygen Saturation (Admit)  96 %    Oxygen Saturation (Exit)  97 %    Rating of Perceived Exertion (Exercise)  9       Social History   Tobacco Use  Smoking Status Former Smoker  . Last attempt to quit: 07/23/1983  . Years since quitting: 34.3  Smokeless Tobacco Never Used    Goals Met:  Proper associated with RPD/PD & O2 Sat Exercise tolerated well Personal goals reviewed No report of cardiac concerns or symptoms Strength training completed today  Goals Unmet:  Not Applicable  Comments: Med review completed   Dr. Emily Filbert is Medical Director for [facility].

## 2017-12-05 NOTE — Progress Notes (Signed)
Cardiac Individual Treatment Plan  Patient Details  Name: Peter Morales MRN: [id number] Date of Birth: 1951/02/21 Referring Provider:     Cardiac Rehab from 12/05/2017 in [facility] Cardiac and Pulmonary Rehab  Referring Provider  Tobe Sos      Initial Encounter Date:    Cardiac Rehab from 12/05/2017 in [facility] Cardiac and Pulmonary Rehab  Date  12/05/17  Referring Provider  Tobe Sos      Visit Diagnosis: NSTEMI (non-ST elevated myocardial infarction) [facility])  Patient's Home Medications on Admission:  Current Outpatient Medications:  .  albuterol (PROVENTIL HFA;VENTOLIN HFA) 108 (90 Base) MCG/ACT inhaler, Inhale 2 puffs into the lungs every 6 (six) hours as needed for wheezing or shortness of breath., Disp: , Rfl:  .  aspirin EC 81 MG tablet, Take 81 mg by mouth daily., Disp: , Rfl:  .  atorvastatin (LIPITOR) 80 MG tablet, Take 80 mg by mouth daily., Disp: , Rfl:  .  carvedilol (COREG) 12.5 MG tablet, Take 12.5 mg by mouth 2 (two) times daily with a meal., Disp: , Rfl:  .  fluticasone (FLONASE) 50 MCG/ACT nasal spray, Place into the nose., Disp: , Rfl:  .  glipiZIDE (GLUCOTROL) 10 MG tablet, Take 10 mg by mouth daily before breakfast., Disp: , Rfl:  .  isosorbide mononitrate (IMDUR) 30 MG 24 hr tablet, Take by mouth., Disp: , Rfl:  .  ketotifen (ZADITOR) 0.025 % ophthalmic solution, Apply to eye., Disp: , Rfl:  .  LORazepam (ATIVAN) 0.5 MG tablet, Take 0.5 mg by mouth every 8 (eight) hours. takr 1/2 tab as needed, Disp: , Rfl:  .  metFORMIN (GLUMETZA) 1000 MG (MOD) 24 hr tablet, Take 1,000 mg by mouth 2 (two) times daily with a meal., Disp: , Rfl:  .  montelukast (SINGULAIR) 10 MG tablet, Take by mouth., Disp: , Rfl:  .  nitroGLYCERIN (NITROSTAT) 0.4 MG SL tablet, Place 0.4 mg under the tongue every 5 (five) minutes as needed for chest pain., Disp: , Rfl:  .  pantoprazole (PROTONIX) 40 MG tablet, Take 40 mg by mouth daily., Disp: , Rfl:  .  rosuvastatin (CRESTOR) 40 MG tablet, Take by mouth.,  Disp: , Rfl:  .  ticagrelor (BRILINTA) 90 MG TABS tablet, Take by mouth., Disp: , Rfl:  .  azelastine (ASTELIN) 0.1 % nasal spray, Place 1 spray into both nostrils 2 (two) times daily. Use in each nostril as directed, Disp: , Rfl:  .  azelastine (OPTIVAR) 0.05 % ophthalmic solution, Place into both eyes 2 (two) times daily., Disp: , Rfl:  .  cetirizine (ZYRTEC) 10 MG tablet, Take 10 mg by mouth daily., Disp: , Rfl:  .  clopidogrel (PLAVIX) 75 MG tablet, Take 75 mg by mouth daily., Disp: , Rfl:  .  cyclobenzaprine (FLEXERIL) 5 MG tablet, Take 5 mg by mouth 3 (three) times daily as needed for muscle spasms., Disp: , Rfl:  .  Diphenhydramine-APAP 25-500 MG TABS, Take 500 mg by mouth every evening., Disp: , Rfl:  .  fenofibrate (TRICOR) 145 MG tablet, Take 145 mg by mouth daily., Disp: , Rfl:  .  fexofenadine (ALLEGRA) 180 MG tablet, Take by mouth., Disp: , Rfl:  .  lisinopril (PRINIVIL,ZESTRIL) 10 MG tablet, Take 10 mg by mouth daily., Disp: , Rfl:  .  Propylhexedrine (BENZEDREX) INHA, Place into the nose., Disp: , Rfl:   Past Medical History: Past Medical History:  Diagnosis Date  . Anxiety   . Coronary artery disease   . Diabetes mellitus without complication ([place])   .  Erectile dysfunction   . GERD (gastroesophageal reflux disease)   . Glaucoma   . Hyperlipidemia   . Hypertension   . Insomnia   . Myocardial infarct ([facility])     Tobacco Use: Social History   Tobacco Use  Smoking Status Former Smoker  . Last attempt to quit: 07/23/1983  . Years since quitting: 34.3  Smokeless Tobacco Never Used    Labs: Recent Review Flowsheet Data    There is no flowsheet data to display.       Exercise Target Goals: Date: 12/05/17  Exercise Program Goal: Individual exercise prescription set with THRR, safety & activity barriers. Participant demonstrates ability to understand and report RPE using BORG scale, to self-measure pulse accurately, and to acknowledge the importance of the exercise  prescription.  Exercise Prescription Goal: Starting with aerobic activity 30 plus minutes a day, 3 days per week for initial exercise prescription. Provide home exercise prescription and guidelines that participant acknowledges understanding prior to discharge.  Activity Barriers & Risk Stratification: Activity Barriers & Cardiac Risk Stratification - 12/05/17 1440      Activity Barriers & Cardiac Risk Stratification   Activity Barriers  Arthritis;Back Problems;Shortness of Breath;Chest Pain/Angina;Joint Problems    Cardiac Risk Stratification  High       6 Minute Walk: 6 Minute Walk    Row Name 12/05/17 1436         6 Minute Walk   Distance  1220 feet     Walk Time  6 minutes     # of Rest Breaks  0     MPH  2.31     METS  2.9     RPE  9     Perceived Dyspnea   0     VO2 Peak  10.2     Symptoms  No     Resting HR  81 bpm     Resting BP  118/62     Resting Oxygen Saturation   96 %     Exercise Oxygen Saturation  during 6 min walk  97 %     Max Ex. HR  116 bpm     Max Ex. BP  150/64     2 Minute Post BP  130/64        Oxygen Initial Assessment:   Oxygen Re-Evaluation:   Oxygen Discharge (Final Oxygen Re-Evaluation):   Initial Exercise Prescription: Initial Exercise Prescription - 12/05/17 1400      Date of Initial Exercise RX and Referring Provider   Date  12/05/17    Referring Provider  Tobe Sos      Treadmill   MPH  2.3    Grade  0.5    Minutes  15    METs  2.9      REL-XR   Level  3    Speed  50    Minutes  15    METs  2.9      T5 Nustep   Level  2    SPM  80    Minutes  15    METs  2.9      Prescription Details   Frequency (times per week)  3    Duration  Progress to 45 minutes of aerobic exercise without signs/symptoms of physical distress      Intensity   THRR 40-80% of Max Heartrate  109-137    Ratings of Perceived Exertion  11-13    Perceived Dyspnea  0-4      Resistance Training  Training Prescription  Yes    Weight  3 lb     Reps  10-15       Perform Capillary Blood Glucose checks as needed.  Exercise Prescription Changes: Exercise Prescription Changes    Row Name 12/05/17 1400             Response to Exercise   Blood Pressure (Admit)  118/62       Blood Pressure (Exercise)  150/64       Blood Pressure (Exit)  130/64       Heart Rate (Admit)  80 bpm       Heart Rate (Exercise)  119 bpm       Heart Rate (Exit)  81 bpm       Oxygen Saturation (Admit)  96 %       Oxygen Saturation (Exit)  97 %       Rating of Perceived Exertion (Exercise)  9          Exercise Comments:   Exercise Goals and Review: Exercise Goals    Row Name 12/05/17 1435             Exercise Goals   Increase Physical Activity  Yes       Intervention  Provide advice, education, support and counseling about physical activity/exercise needs.;Develop an individualized exercise prescription for aerobic and resistive training based on initial evaluation findings, risk stratification, comorbidities and participant's personal goals.       Expected Outcomes  Achievement of increased cardiorespiratory fitness and enhanced flexibility, muscular endurance and strength shown through measurements of functional capacity and personal statement of participant.       Increase Strength and Stamina  Yes       Intervention  Provide advice, education, support and counseling about physical activity/exercise needs.;Develop an individualized exercise prescription for aerobic and resistive training based on initial evaluation findings, risk stratification, comorbidities and participant's personal goals.       Expected Outcomes  Achievement of increased cardiorespiratory fitness and enhanced flexibility, muscular endurance and strength shown through measurements of functional capacity and personal statement of participant.       Able to understand and use rate of perceived exertion (RPE) scale  Yes       Intervention  Provide education and explanation  on how to use RPE scale       Expected Outcomes  Short Term: Able to use RPE daily in rehab to express subjective intensity level;Long Term:  Able to use RPE to guide intensity level when exercising independently       Able to understand and use Dyspnea scale  Yes       Intervention  Provide education and explanation on how to use Dyspnea scale       Expected Outcomes  Short Term: Able to use Dyspnea scale daily in rehab to express subjective sense of shortness of breath during exertion;Long Term: Able to use Dyspnea scale to guide intensity level when exercising independently       Knowledge and understanding of Target Heart Rate Range (THRR)  Yes       Intervention  Provide education and explanation of THRR including how the numbers were predicted and where they are located for reference       Expected Outcomes  Long Term: Able to use THRR to govern intensity when exercising independently;Short Term: Able to state/look up THRR;Short Term: Able to use daily as guideline for intensity in rehab  Able to check pulse independently  Yes       Intervention  Provide education and demonstration on how to check pulse in carotid and radial arteries.;Review the importance of being able to check your own pulse for safety during independent exercise       Expected Outcomes  Short Term: Able to explain why pulse checking is important during independent exercise;Long Term: Able to check pulse independently and accurately       Understanding of Exercise Prescription  Yes       Intervention  Provide education, explanation, and written materials on patient's individual exercise prescription       Expected Outcomes  Short Term: Able to explain program exercise prescription;Long Term: Able to explain home exercise prescription to exercise independently          Exercise Goals Re-Evaluation :   Discharge Exercise Prescription (Final Exercise Prescription Changes): Exercise Prescription Changes - 12/05/17 1400       Response to Exercise   Blood Pressure (Admit)  118/62    Blood Pressure (Exercise)  150/64    Blood Pressure (Exit)  130/64    Heart Rate (Admit)  80 bpm    Heart Rate (Exercise)  119 bpm    Heart Rate (Exit)  81 bpm    Oxygen Saturation (Admit)  96 %    Oxygen Saturation (Exit)  97 %    Rating of Perceived Exertion (Exercise)  9       Nutrition:  Target Goals: Understanding of nutrition guidelines, daily intake of sodium 1500mg , cholesterol 200mg , calories 30% from fat and 7% or less from saturated fats, daily to have 5 or more servings of fruits and vegetables.  Biometrics: Pre Biometrics - 12/05/17 1434      Pre Biometrics   Height  5\' 6"  (1.676 m)    Weight  198 lb 8 oz (90 kg)    Waist Circumference  42 inches    Hip Circumference  42 inches    Waist to Hip Ratio  1 %    BMI (Calculated)  32.05    Single Leg Stand  20.66 seconds        Nutrition Therapy Plan and Nutrition Goals: Nutrition Therapy & Goals - 12/05/17 1204      Intervention Plan   Intervention  Prescribe, educate and counsel regarding individualized specific dietary modifications aiming towards targeted core components such as weight, hypertension, lipid management, diabetes, heart failure and other comorbidities.    Expected Outcomes  Short Term Goal: Understand basic principles of dietary content, such as calories, fat, sodium, cholesterol and nutrients.;Short Term Goal: A plan has been developed with personal nutrition goals set during dietitian appointment.;Long Term Goal: Adherence to prescribed nutrition plan.       Nutrition Discharge: Rate Your Plate Scores: Nutrition Assessments - 12/05/17 1204      MEDFICTS Scores   Pre Score  24       Nutrition Goals Re-Evaluation:   Nutrition Goals Discharge (Final Nutrition Goals Re-Evaluation):   Psychosocial: Target Goals: Acknowledge presence or absence of significant depression and/or stress, maximize coping skills, provide positive  support system. Participant is able to verbalize types and ability to use techniques and skills needed for reducing stress and depression.   Initial Review & Psychosocial Screening: Initial Psych Review & Screening - 12/05/17 1437      Initial Review   Current issues with  Current Stress Concerns    Source of Stress Concerns  Occupation    Comments  Aragorn's job is very stressful. 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 He knows that this job is very stressful and said each of his heart attacks has happened after a very stressful event so he knows he needs to be careful. He also reported that his wife's health is declining some and he knows his health is not getting better      [place]ack[place]?  Yes wife, daughters here, sons in [place] and [place]      Screening Interventions   Interventions  Yes;Encouraged to exercise    Expected Outcomes  Short Term goal: Utilizing psychosocial counselor, staff and physician to assist with identification of specific Stressors or current issues interfering with healing process. Setting desired goal for each stressor or current issue identified.;Long Term Goal: Stressors or current issues are controlled or eliminated.;Short Term goal: Identification and review with participant of any Quality of Life or Depression concerns found by scoring the questionnaire.;Long Term goal: The participant improves quality of Life and PHQ9 Scores as seen by post scores and/or verbalization of changes       Quality of Life Scores:  Quality of Life - 12/05/17 1204      Quality of Life Scores   Health/Function Pre  18.77 %    Socioeconomic Pre  23.83 %    Psych/Spiritual Pre  24.93 %    Family Pre  27.6 %    GLOBAL Pre  22.33 %       PHQ-9: Recent Review Flowsheet Data    Depression screen [facility] 2/9 12/05/2017   Decreased Interest 1   Down, Depressed, Hopeless 0   PHQ - 2 Score 1   Altered sleeping 3    Tired, decreased energy 2   Change in appetite 1   Feeling bad or failure about yourself  0   Trouble concentrating 0   Moving slowly or fidgety/restless 0   Suicidal thoughts 0   PHQ-9 Score 7   Difficult doing work/chores Somewhat difficult     Interpretation of Total Score  Total Score Depression Severity:  1-4 = Minimal depression, 5-9 = Mild depression, 10-14 = Moderate depression, 15-19 = Moderately severe depression, 20-27 = Severe depression   Psychosocial Evaluation and Intervention:   Psychosocial Re-Evaluation:   Psychosocial Discharge (Final Psychosocial Re-Evaluation):   Vocational Rehabilitation: Provide vocational rehab assistance to qualifying candidates.   Vocational Rehab Evaluation & Intervention: Vocational Rehab - 12/05/17 1439      Initial Vocational Rehab Evaluation & Intervention   Assessment shows need for Vocational Rehabilitation  No       Education: Education Goals: Education classes will be provided on a variety of topics geared toward better understanding of heart health and risk factor modification. Participant will state understanding/return demonstration of topics presented as noted by education test scores.  Learning Barriers/Preferences: Learning Barriers/Preferences - 12/05/17 1439      Learning Barriers/Preferences   Learning Barriers  None    Learning Preferences  None       Education Topics: General Nutrition Guidelines/Fats and Fiber: -Group instruction provided by verbal, written material, models and posters to present the general guidelines for heart healthy nutrition. Gives an explanation and review of dietary fats and fiber.   Controlling Sodium/Reading Food Labels: -Group verbal and written material supporting the discussion of sodium use in heart healthy nutrition. Review and explanation with models, verbal and written materials for utilization of the food  label.   Exercise Physiology & Risk Factors: - Group verbal  and written instruction with models to review the exercise physiology of the cardiovascular system and associated critical values. Details cardiovascular disease risk factors and the goals associated with each risk factor.   Aerobic Exercise & Resistance Training: - Gives group verbal and written discussion on the health impact of inactivity. On the components of aerobic and resistive training programs and the benefits of this training and how to safely progress through these programs.   Flexibility, Balance, General Exercise Guidelines: - Provides group verbal and written instruction on the benefits of flexibility and balance training programs. Provides general exercise guidelines with specific guidelines to those with heart or lung disease. Demonstration and skill practice provided.   Stress Management: - Provides group verbal and written instruction about the health risks of elevated stress, cause of high stress, and healthy ways to reduce stress.   Depression: - Provides group verbal and written instruction on the correlation between heart/lung disease and depressed mood, treatment options, and the stigmas associated with seeking treatment.   Anatomy & Physiology of the Heart: - Group verbal and written instruction and models provide basic cardiac anatomy and physiology, with the coronary electrical and arterial systems. Review of: AMI, Angina, Valve disease, Heart Failure, Cardiac Arrhythmia, Pacemakers, and the ICD.   Cardiac Procedures: - Group verbal and written instruction to review commonly prescribed medications for heart disease. Reviews the medication, class of the drug, and side effects. Includes the steps to properly store meds and maintain the prescription regimen. (beta blockers and nitrates)   Cardiac Medications I: - Group verbal and written instruction to review commonly prescribed medications for heart disease. Reviews the medication, class of the drug, and side  effects. Includes the steps to properly store meds and maintain the prescription regimen.   Cardiac Medications II: -Group verbal and written instruction to review commonly prescribed medications for heart disease. Reviews the medication, class of the drug, and side effects. (all other drug classes)    Go Sex-Intimacy & Heart Disease, Get SMART - Goal Setting: - Group verbal and written instruction through game format to discuss heart disease and the return to sexual intimacy. Provides group verbal and written material to discuss and apply goal setting through the application of the S.M.A.R.T. Method.   Other Matters of the Heart: - Provides group verbal, written materials and models to describe Heart Failure, Angina, Valve Disease, Peripheral Artery Disease, and Diabetes in the realm of heart disease. Includes description of the disease process and treatment options available to the cardiac patient.   Exercise & Equipment Safety: - Individual verbal instruction and demonstration of equipment use and safety with use of the equipment.   Cardiac Rehab from 12/05/2017 in [facility] Cardiac and Pulmonary Rehab  Date  12/05/17  Educator  [facility]  Instruction Review Code  1- Verbalizes Understanding      Infection Prevention: - Provides verbal and written material to individual with discussion of infection control including proper hand washing and proper equipment cleaning during exercise session.   Cardiac Rehab from 12/05/2017 in [facility] Cardiac and Pulmonary Rehab  Date  12/05/17  Educator  [facility]  Instruction Review Code  1- Verbalizes Understanding      Falls Prevention: - Provides verbal and written material to individual with discussion of falls prevention and safety.   Cardiac Rehab from 12/05/2017 in [facility] Cardiac and Pulmonary Rehab  Date  12/05/17  Educator  [facility]  Instruction Review Code  1-  Verbalizes Understanding      Diabetes: - Individual verbal and written instruction to review  signs/symptoms of diabetes, desired ranges of glucose level fasting, after meals and with exercise. Acknowledge that pre and post exercise glucose checks will be done for 3 sessions at entry of program.   Cardiac Rehab from 12/05/2017 in [facility] Cardiac and Pulmonary Rehab  Date  12/05/17  Educator  [facility]  Instruction Review Code  1- Verbalizes Understanding      Other: -Provides group and verbal instruction on various topics (see comments)    Knowledge Questionnaire Score: Knowledge Questionnaire Score - 12/05/17 1207      Knowledge Questionnaire Score   Pre Score  23/28       Core Components/Risk Factors/Patient Goals at Admission: Personal Goals and Risk Factors at Admission - 12/05/17 1433      Core Components/Risk Factors/Patient Goals on Admission    Weight Management  Yes;Obesity;Weight Loss    Intervention  Weight Management: Develop a combined nutrition and exercise program designed to reach desired caloric intake, while maintaining appropriate intake of nutrient and fiber, sodium and fats, and appropriate energy expenditure required for the weight goal.;Weight Management: Provide education and appropriate resources to help participant work on and attain dietary goals.;Weight Management/Obesity: Establish reasonable short term and long term weight goals.;Obesity: Provide education and appropriate resources to help participant work on and attain dietary goals.    Admit Weight  198 lb 8 oz (90 kg)    Goal Weight: Short Term  194 lb (88 kg)    Goal Weight: Long Term  170 lb (77.1 kg)    Expected Outcomes  Short Term: Continue to assess and modify interventions until short term weight is achieved;Long Term: Adherence to nutrition and physical activity/exercise program aimed toward attainment of established weight goal;Weight Loss: Understanding of general recommendations for a balanced deficit meal plan, which promotes 1-2 lb weight loss per week and includes a negative energy balance of  [phone] kcal/d;Understanding recommendations for meals to include 15-35% energy as protein, 25-35% energy from fat, 35-60% energy from carbohydrates, less than 200mg  of dietary cholesterol, 20-35 gm of total fiber daily;Understanding of distribution of calorie intake throughout the day with the consumption of 4-5 meals/snacks    Diabetes  Yes    Intervention  Provide education about signs/symptoms and action to take for hypo/hyperglycemia.;Provide education about proper nutrition, including hydration, and aerobic/resistive exercise prescription along with prescribed medications to achieve blood glucose in normal ranges: Fasting glucose 65-99 mg/dL    Expected Outcomes  Short Term: Participant verbalizes understanding of the signs/symptoms and immediate care of hyper/hypoglycemia, proper foot care and importance of medication, aerobic/resistive exercise and nutrition plan for blood glucose control.;Long Term: Attainment of HbA1C < 7%.    Hypertension  Yes    Intervention  Provide education on lifestyle modifcations including regular physical activity/exercise, weight management, moderate sodium restriction and increased consumption of fresh fruit, vegetables, and low fat dairy, alcohol moderation, and smoking cessation.;Monitor prescription use compliance.    Expected Outcomes  Short Term: Continued assessment and intervention until BP is < 140/83mm HG in hypertensive participants. < 130/63mm HG in hypertensive participants with diabetes, heart failure or chronic kidney disease.;Long Term: Maintenance of blood pressure at goal levels.    Lipids  Yes    Intervention  Provide education and support for participant on nutrition & aerobic/resistive exercise along with prescribed medications to achieve LDL 70mg , HDL >40mg .    Expected Outcomes  Short Term: Participant states understanding of  desired cholesterol values and is compliant with medications prescribed. Participant is following exercise prescription  and nutrition guidelines.;Long Term: Cholesterol controlled with medications as prescribed, with individualized exercise RX and with personalized nutrition plan. Value goals: LDL < 70mg , HDL > 40 mg.    Stress  Yes Corian's job is very stressful. His wife's health is also declining some. His doctor told him 60% of his heart issues are caused by genetics which is discouraging to him.     Intervention  Offer individual and/or small group education and counseling on adjustment to heart disease, stress management and health-related lifestyle change. Teach and support self-help strategies.;Refer participants experiencing significant psychosocial distress to appropriate mental health specialists for further evaluation and treatment. When possible, include family members and significant others in education/counseling sessions.    Expected Outcomes  Short Term: Participant demonstrates changes in health-related behavior, relaxation and other stress management skills, ability to obtain effective social support, and compliance with psychotropic medications if prescribed.;Long Term: Emotional wellbeing is indicated by absence of clinically significant psychosocial distress or social isolation.       Core Components/Risk Factors/Patient Goals Review:    Core Components/Risk Factors/Patient Goals at Discharge (Final Review):    ITP Comments: ITP Comments    Row Name 12/05/17 1202           ITP Comments  medical review completed. ITP sent to Dr Elta Guadeloupe Sabra Heck for review,changes as needed and signature   Documentation of diagnosis can be found in [facility] 11/05/2017 hospital admit          Comments: Initial ITP

## 2017-12-05 NOTE — Patient Instructions (Signed)
Patient Instructions  Patient Details  Name: Peter Morales MRN: [id number] Date of Birth: December 29, 1950 Referring Provider:  Leonia Morales,*  Below are the personal goals you chose as well as exercise and nutrition goals. Our goal is to help you keep on track towards obtaining and maintaining your goals. We will be discussing your progress on these goals with you throughout the program.  Initial Exercise Prescription: Initial Exercise Prescription - 12/05/17 1400      Date of Initial Exercise RX and Referring Provider   Date  12/05/17    Referring Provider  Peter Morales      Treadmill   MPH  2.3    Grade  0.5    Minutes  15    METs  2.9      REL-XR   Level  3    Speed  50    Minutes  15    METs  2.9      T5 Nustep   Level  2    SPM  80    Minutes  15    METs  2.9      Prescription Details   Frequency (times per week)  3    Duration  Progress to 45 minutes of aerobic exercise without signs/symptoms of physical distress      Intensity   THRR 40-80% of Max Heartrate  109-137    Ratings of Perceived Exertion  11-13    Perceived Dyspnea  0-4      Resistance Training   Training Prescription  Yes    Weight  3 lb    Reps  10-15       Exercise Goals: Frequency: Be able to perform aerobic exercise three times per week working toward 3-5 days per week.  Intensity: Work with a perceived exertion of 11 (fairly light) - 15 (hard) as tolerated. Follow your new exercise prescription and watch for changes in prescription as you progress with the program. Changes will be reviewed with you when they are made.  Duration: You should be able to do 30 minutes of continuous aerobic exercise in addition to a 5 minute warm-up and a 5 minute cool-down routine.  Nutrition Goals: Your personal nutrition goals will be established when you do your nutrition analysis with the dietician.  The following are nutrition guidelines to follow: Cholesterol < 200mg /day Sodium <  1500mg /day Fiber: Men over 50 yrs - 30 grams per day  Personal Goals: Personal Goals and Risk Factors at Admission - 12/05/17 1433      Core Components/Risk Factors/Patient Goals on Admission    Weight Management  Yes;Obesity;Weight Loss    Intervention  Weight Management: Develop a combined nutrition and exercise program designed to reach desired caloric intake, while maintaining appropriate intake of nutrient and fiber, sodium and fats, and appropriate energy expenditure required for the weight goal.;Weight Management: Provide education and appropriate resources to help participant work on and attain dietary goals.;Weight Management/Obesity: Establish reasonable short term and long term weight goals.;Obesity: Provide education and appropriate resources to help participant work on and attain dietary goals.    Admit Weight  198 lb 8 oz (90 kg)    Goal Weight: Short Term  194 lb (88 kg)    Goal Weight: Long Term  170 lb (77.1 kg)    Expected Outcomes  Short Term: Continue to assess and modify interventions until short term weight is achieved;Long Term: Adherence to nutrition and physical activity/exercise program aimed toward attainment of established weight goal;Weight Loss: Understanding  of general recommendations for a balanced deficit meal plan, which promotes 1-2 lb weight loss per week and includes a negative energy balance of [id number] kcal/d;Understanding recommendations for meals to include 15-35% energy as protein, 25-35% energy from fat, 35-60% energy from carbohydrates, less than 200mg  of dietary cholesterol, 20-35 gm of total fiber daily;Understanding of distribution of calorie intake throughout the day with the consumption of 4-5 meals/snacks    Diabetes  Yes    Intervention  Provide education about signs/symptoms and action to take for hypo/hyperglycemia.;Provide education about proper nutrition, including hydration, and aerobic/resistive exercise prescription along with prescribed  medications to achieve blood glucose in normal ranges: Fasting glucose 65-99 mg/dL    Expected Outcomes  Short Term: Participant verbalizes understanding of the signs/symptoms and immediate care of hyper/hypoglycemia, proper foot care and importance of medication, aerobic/resistive exercise and nutrition plan for blood glucose control.;Long Term: Attainment of HbA1C < 7%.    Hypertension  Yes    Intervention  Provide education on lifestyle modifcations including regular physical activity/exercise, weight management, moderate sodium restriction and increased consumption of fresh fruit, vegetables, and low fat dairy, alcohol moderation, and smoking cessation.;Monitor prescription use compliance.    Expected Outcomes  Short Term: Continued assessment and intervention until BP is < 140/76mm HG in hypertensive participants. < 130/38mm HG in hypertensive participants with diabetes, heart failure or chronic kidney disease.;Long Term: Maintenance of blood pressure at goal levels.    Lipids  Yes    Intervention  Provide education and support for participant on nutrition & aerobic/resistive exercise along with prescribed medications to achieve LDL 70mg , HDL >40mg .    Expected Outcomes  Short Term: Participant states understanding of desired cholesterol values and is compliant with medications prescribed. Participant is following exercise prescription and nutrition guidelines.;Long Term: Cholesterol controlled with medications as prescribed, with individualized exercise RX and with personalized nutrition plan. Value goals: LDL < 70mg , HDL > 40 mg.    Stress  Yes Peter Morales's job is very stressful. His wife's health is also declining some. His doctor told him 60% of his heart issues are caused by genetics which is discouraging to him.     Intervention  Offer individual and/or small group education and counseling on adjustment to heart disease, stress management and health-related lifestyle change. Teach and support  self-help strategies.;Refer participants experiencing significant psychosocial distress to appropriate mental health specialists for further evaluation and treatment. When possible, include family members and significant others in education/counseling sessions.    Expected Outcomes  Short Term: Participant demonstrates changes in health-related behavior, relaxation and other stress management skills, ability to obtain effective social support, and compliance with psychotropic medications if prescribed.;Long Term: Emotional wellbeing is indicated by absence of clinically significant psychosocial distress or social isolation.       Tobacco Use Initial Evaluation: Social History   Tobacco Use  Smoking Status Former Smoker  . Last attempt to quit: 07/23/1983  . Years since quitting: 34.3  Smokeless Tobacco Never Used    Exercise Goals and Review: Exercise Goals    Row Name 12/05/17 1435             Exercise Goals   Increase Physical Activity  Yes       Intervention  Provide advice, education, support and counseling about physical activity/exercise needs.;Develop an individualized exercise prescription for aerobic and resistive training based on initial evaluation findings, risk stratification, comorbidities and participant's personal goals.       Expected Outcomes  Achievement of increased cardiorespiratory fitness  and enhanced flexibility, muscular endurance and strength shown through measurements of functional capacity and personal statement of participant.       Increase Strength and Stamina  Yes       Intervention  Provide advice, education, support and counseling about physical activity/exercise needs.;Develop an individualized exercise prescription for aerobic and resistive training based on initial evaluation findings, risk stratification, comorbidities and participant's personal goals.       Expected Outcomes  Achievement of increased cardiorespiratory fitness and enhanced  flexibility, muscular endurance and strength shown through measurements of functional capacity and personal statement of participant.       Able to understand and use rate of perceived exertion (RPE) scale  Yes       Intervention  Provide education and explanation on how to use RPE scale       Expected Outcomes  Short Term: Able to use RPE daily in rehab to express subjective intensity level;Long Term:  Able to use RPE to guide intensity level when exercising independently       Able to understand and use Dyspnea scale  Yes       Intervention  Provide education and explanation on how to use Dyspnea scale       Expected Outcomes  Short Term: Able to use Dyspnea scale daily in rehab to express subjective sense of shortness of breath during exertion;Long Term: Able to use Dyspnea scale to guide intensity level when exercising independently       Knowledge and understanding of Target Heart Rate Range (THRR)  Yes       Intervention  Provide education and explanation of THRR including how the numbers were predicted and where they are located for reference       Expected Outcomes  Long Term: Able to use THRR to govern intensity when exercising independently;Short Term: Able to state/look up THRR;Short Term: Able to use daily as guideline for intensity in rehab       Able to check pulse independently  Yes       Intervention  Provide education and demonstration on how to check pulse in carotid and radial arteries.;Review the importance of being able to check your own pulse for safety during independent exercise       Expected Outcomes  Short Term: Able to explain why pulse checking is important during independent exercise;Long Term: Able to check pulse independently and accurately       Understanding of Exercise Prescription  Yes       Intervention  Provide education, explanation, and written materials on patient's individual exercise prescription       Expected Outcomes  Short Term: Able to explain program  exercise prescription;Long Term: Able to explain home exercise prescription to exercise independently          Copy of goals given to participant.

## 2017-12-11 DIAGNOSIS — I214 Non-ST elevation (NSTEMI) myocardial infarction: Secondary | ICD-10-CM | POA: Diagnosis not present

## 2017-12-11 LAB — GLUCOSE, CAPILLARY
Glucose-Capillary: 170 mg/dL — ABNORMAL HIGH (ref 65–99)
Glucose-Capillary: 175 mg/dL — ABNORMAL HIGH (ref 65–99)

## 2017-12-11 NOTE — Progress Notes (Signed)
Daily Session Note  Patient Details  Name: Peter Morales MRN: [id number] Date of Birth: 02-06-1951 Referring Provider:     Cardiac Rehab from 12/05/2017 in [facility] Cardiac [facility] Pul[facility] Rehab  Referring Provider  Tobe Sos      Encounter Date: 12/11/2017  Check In: Session Check In - 12/11/17 0735      Check-In   Location  [facility]-Cardiac & Pulmonary Rehab    Staff Present  Justin Mend RCP,RRT,BSRT;Krista Frederico Hamman, RN BSN;Jessica Luan Pulling, MA, [place], CCRP, Exercise Physiologist    Supervising physician immediately available to respond to emergencies  See telemetry face sheet for immediately available ER MD    Medication changes reported      No    Fall or balance concerns reported     No    Warm-up and Cool-down  Performed on first and last piece of equipment    Resistance Training Performed  Yes    VAD Patient?  No      Pain Assessment   Currently in Pain?  No/denies          Social History   Tobacco Use  Smoking Status Former Smoker  . Last attempt to quit: 07/23/1983  . Years since quitting: 34.4  Smokeless Tobacco Never Used    Goals Met:  Exercise tolerated well No report of cardiac concerns or symptoms Strength training completed today  Goals Unmet:  Not Applicable  Comments: First full day of exercise!  Patient was oriented to gym and equipment including functions, settings, policies, and procedures.  Patient's individual exercise prescription and treatment plan were reviewed.  All starting workloads were established based on the results of the 6 minute walk test done at initial orientation visit.  The plan for exercise progression was also introduced and progression will be customized based on patient's performance and goals.   Dr. Emily Filbert is Medical Director for [place] and [facility].

## 2017-12-13 DIAGNOSIS — I214 Non-ST elevation (NSTEMI) myocardial infarction: Secondary | ICD-10-CM

## 2017-12-13 LAB — GLUCOSE, CAPILLARY
GLUCOSE-CAPILLARY: 165 mg/dL — AB (ref 65–99)
Glucose-Capillary: 150 mg/dL — ABNORMAL HIGH (ref 65–99)

## 2017-12-13 NOTE — Progress Notes (Signed)
Daily Session Note  Patient Details  Name: Peter Morales MRN: [id number] Date of Birth: 11-05-1951 Referring Provider:     Cardiac Rehab from 12/05/2017 in [facility] Cardiac and Pulmonary Rehab  Referring Provider  Tobe Sos      Encounter Date: 12/13/2017  Check In: Session Check In - 12/13/17 0839      Check-In   Location  [facility]-Cardiac [facility] Pul[facility] Rehab    Staff Present  Alberteen Sam, MA, [place], CCRP, Exercise Physiologist;Meredith Sherryll Burger, RN Vickki Hearing, BA, ACSM CEP, Exercise Physiologist    Supervising physician immediately available to respond to emergencies  See telemetry face sheet for immediately available ER MD    Medication changes reported      No    Fall or balance concerns reported     No    Warm-up and Cool-down  Performed on first and last piece of equipment    Resistance Training Performed  Yes    VAD Patient?  No      Pain Assessment   Currently in Pain?  No/denies    Multiple Pain Sites  No          Social History   Tobacco Use  Smoking Status Former Smoker  . Last attempt to quit: 07/23/1983  . Years since quitting: 34.4  Smokeless Tobacco Never Used    Goals Met:  Independence with exercise equipment Exercise tolerated well No report of cardiac concerns or symptoms Strength training completed today  Goals Unmet:  Not Applicable  Comments: Pt able to follow exercise prescription today without complaint.  Will continue to monitor for progression.    Dr. Emily Filbert is Medical Director for [place] and [facility].

## 2017-12-14 DIAGNOSIS — R0602 Shortness of breath: Secondary | ICD-10-CM | POA: Insufficient documentation

## 2017-12-16 ENCOUNTER — Telehealth: Payer: Self-pay

## 2017-12-16 NOTE — Telephone Encounter (Signed)
Peter Morales called to say he was not in [facility] today due to his wife being sick.

## 2017-12-18 DIAGNOSIS — I214 Non-ST elevation (NSTEMI) myocardial infarction: Secondary | ICD-10-CM

## 2017-12-18 LAB — GLUCOSE, CAPILLARY
Glucose-Capillary: 152 mg/dL — ABNORMAL HIGH (ref 65–99)
Glucose-Capillary: 178 mg/dL — ABNORMAL HIGH (ref 65–99)

## 2017-12-18 NOTE — Progress Notes (Signed)
Daily Session Note  Patient Details  Name: Peter Morales MRN: [id number] Date of Birth: 1951/01/17 Referring Provider:     Cardiac Rehab from 12/05/2017 in [facility] Cardiac and Pulmonary Rehab  Referring Provider  Tobe Sos      Encounter Date: 12/18/2017  Check In: Session Check In - 12/18/17 0735      Check-In   Location  [facility]-Cardiac & Pulmonary Rehab    Staff Present  Justin Mend RCP,RRT,BSRT;Carroll Enterkin, RN, Levie Heritage, MA, RCEP, CCRP, Exercise Physiologist    Supervising physician immediately available to respond to emergencies  See telemetry face sheet for immediately available ER MD    Medication changes reported      No    Fall or balance concerns reported     No    Warm-up and Cool-down  Performed on first and last piece of equipment    Resistance Training Performed  Yes    VAD Patient?  No      Pain Assessment   Currently in Pain?  No/denies          Social History   Tobacco Use  Smoking Status Former Smoker  . Last attempt to quit: 07/23/1983  . Years since quitting: 34.4  Smokeless Tobacco Never Used    Goals Met:  Independence with exercise equipment Exercise tolerated well No report of cardiac concerns or symptoms Strength training completed today  Goals Unmet:  Not Applicable  Comments: Pt able to follow exercise prescription today without complaint.  Will continue to monitor for progression. Reviewed home exercise with pt today.  Pt plans to walk at home and stores for exercise.  Reviewed THR, pulse, RPE, sign and symptoms, NTG use, and when to call 911 or MD.  Also discussed weather considerations and indoor options.  Pt voiced understanding.   Dr. Emily Filbert is Medical Director for [place] and [facility].

## 2017-12-25 ENCOUNTER — Encounter: Payer: Self-pay | Admitting: *Deleted

## 2017-12-25 DIAGNOSIS — I214 Non-ST elevation (NSTEMI) myocardial infarction: Secondary | ICD-10-CM

## 2017-12-25 NOTE — Progress Notes (Signed)
Cardiac Individual Treatment Plan  Patient Details  Name: Antionne Enrique MRN: [id number] Date of Birth: 23-Aug-1951 Referring Provider:     Cardiac Rehab from 12/05/2017 in [facility]iac [facility] Pulmonary Rehab  Referring Provider  Tobe Sos      Initial Encounter Date:    Cardiac Rehab from 12/05/2017 in [facility] Cardiac and Pulmonary Rehab  Date  12/05/17  Referring Provider  Tobe Sos      Visit Diagnosis: NSTEMI (non-ST elevated myocardial infarction) [facility])  Patient's Home Medications on Admission:  Current Outpatient Medications:  .  albuterol (PROVENTIL HFA;VENTOLIN HFA) 108 (90 Base) MCG/ACT inhaler, Inhale 2 puffs into the lungs every 6 (six) hours as needed for wheezing or shortness of breath., Disp: , Rfl:  .  aspirin EC 81 MG tablet, Take 81 mg by mouth daily., Disp: , Rfl:  .  atorvastatin (LIPITOR) 80 MG tablet, Take 80 mg by mouth daily., Disp: , Rfl:  .  azelastine (ASTELIN) 0.1 % nasal spray, Place 1 spray into both nostrils 2 (two) times daily. Use in each nostril as directed, Disp: , Rfl:  .  azelastine (OPTIVAR) 0.05 % ophthalmic solution, Place into both eyes 2 (two) times daily., Disp: , Rfl:  .  carvedilol (COREG) 12.5 MG tablet, Take 12.5 mg by mouth 2 (two) times daily with a meal., Disp: , Rfl:  .  cetirizine (ZYRTEC) 10 MG tablet, Take 10 mg by mouth daily., Disp: , Rfl:  .  clopidogrel (PLAVIX) 75 MG tablet, Take 75 mg by mouth daily., Disp: , Rfl:  .  cyclobenzaprine (FLEXERIL) 5 MG tablet, Take 5 mg by mouth 3 (three) times daily as needed for muscle spasms., Disp: , Rfl:  .  Diphenhydramine-APAP 25-500 MG TABS, Take 500 mg by mouth every evening., Disp: , Rfl:  .  fenofibrate (TRICOR) 145 MG tablet, Take 145 mg by mouth daily., Disp: , Rfl:  .  fexofenadine (ALLEGRA) 180 MG tablet, Take by mouth., Disp: , Rfl:  .  fluticasone (FLONASE) 50 MCG/ACT nasal spray, Place into the nose., Disp: , Rfl:  .  glipiZIDE (GLUCOTROL) 10 MG tablet, Take 10 mg by mouth daily before  breakfast., Disp: , Rfl:  .  isosorbide mononitrate (IMDUR) 30 MG 24 hr tablet, Take by mouth., Disp: , Rfl:  .  ketotifen (ZADITOR) 0.025 % ophthalmic solution, Apply to eye., Disp: , Rfl:  .  lisinopril (PRINIVIL,ZESTRIL) 10 MG tablet, Take 10 mg by mouth daily., Disp: , Rfl:  .  LORazepam (ATIVAN) 0.5 MG tablet, Take 0.5 mg by mouth every 8 (eight) hours. takr 1/2 tab as needed, Disp: , Rfl:  .  metFORMIN (GLUMETZA) 1000 MG (MOD) 24 hr tablet, Take 1,000 mg by mouth 2 (two) times daily with a meal., Disp: , Rfl:  .  montelukast (SINGULAIR) 10 MG tablet, Take by mouth., Disp: , Rfl:  .  nitroGLYCERIN (NITROSTAT) 0.4 MG SL tablet, Place 0.4 mg under the tongue every 5 (five) minutes as needed for chest pain., Disp: , Rfl:  .  pantoprazole (PROTONIX) 40 MG tablet, Take 40 mg by mouth daily., Disp: , Rfl:  .  Propylhexedrine (BENZEDREX) INHA, Place into the nose., Disp: , Rfl:  .  rosuvastatin (CRESTOR) 40 MG tablet, Take by mouth., Disp: , Rfl:  .  ticagrelor (BRILINTA) 90 MG TABS tablet, Take by mouth., Disp: , Rfl:   Past Medical History: Past Medical History:  Diagnosis Date  . Anxiety   . Coronary artery disease   . Diabetes mellitus without complication ([place])   .  Erectile dysfunction   . GERD (gastroesophageal reflux disease)   . Glaucoma   . Hyperlipidemia   . Hypertension   . Insomnia   . Myocardial infarct ([facility])     Tobacco Use: Social History   Tobacco Use  Smoking Status Former Smoker  . Last attempt to quit: 07/23/1983  . Years since quitting: 34.4  Smokeless Tobacco Never Used    Labs: Recent Review Flowsheet Data    There is no flowsheet data to display.       Exercise Target Goals:    Exercise Program Goal: Individual exercise prescription set using results from initial 6 min walk test and THRR while considering  patient's activity barriers and safety.   Exercise Prescription Goal: Initial exercise prescription builds to 30-45 minutes a day of aerobic  activity, 2-3 days per week.  Home exercise guidelines will be given to patient during program as part of exercise prescription that the participant will acknowledge.  Activity Barriers & Risk Stratification: Activity Barriers & Cardiac Risk Stratification - 12/05/17 1440      Activity Barriers & Cardiac Risk Stratification   Activity Barriers  Arthritis;Back Problems;Shortness of Breath;Chest Pain/Angina;Joint Problems    Cardiac Risk Stratification  High       6 Minute Walk: 6 Minute Walk    Row Name 12/05/17 1436         6 Minute Walk   Distance  1220 feet     Walk Time  6 minutes     # of Rest Breaks  0     MPH  2.31     METS  2.9     RPE  9     Perceived Dyspnea   0     VO2 Peak  10.2     Symptoms  No     Resting HR  81 bpm     Resting BP  118/62     Resting Oxygen Saturation   96 %     Exercise Oxygen Saturation  during 6 min walk  97 %     Max Ex. HR  116 bpm     Max Ex. BP  150/64     2 Minute Post BP  130/64        Oxygen Initial Assessment:   Oxygen Re-Evaluation:   Oxygen Discharge (Final Oxygen Re-Evaluation):   Initial Exercise Prescription: Initial Exercise Prescription - 12/05/17 1400      Date of Initial Exercise RX and Referring Provider   Date  12/05/17    Referring Provider  Tobe Sos      Treadmill   MPH  2.3    Grade  0.5    Minutes  15    METs  2.9      REL-XR   Level  3    Speed  50    Minutes  15    METs  2.9      T5 Nustep   Level  2    SPM  80    Minutes  15    METs  2.9      Prescription Details   Frequency (times per week)  3    Duration  Progress to 45 minutes of aerobic exercise without signs/symptoms of physical distress      Intensity   THRR 40-80% of Max Heartrate  109-137    Ratings of Perceived Exertion  11-13    Perceived Dyspnea  0-4      Resistance Training   Training Prescription  Yes    Weight  3 lb    Reps  10-15       Perform Capillary Blood Glucose checks as needed.  Exercise Prescription  Changes: Exercise Prescription Changes    Row Name 12/05/17 1400 12/16/17 1500 12/18/17 0800         Response to Exercise   Blood Pressure (Admit)  118/62  128/72  -     Blood Pressure (Exercise)  150/64  124/56  -     Blood Pressure (Exit)  130/64  120/70  -     Heart Rate (Admit)  80 bpm  85 bpm  -     Heart Rate (Exercise)  119 bpm  116 bpm  -     Heart Rate (Exit)  81 bpm  97 bpm  -     Oxygen Saturation (Admit)  96 %  -  -     Oxygen Saturation (Exit)  97 %  -  -     Rating of Perceived Exertion (Exercise)  9  12  -     Symptoms  -  none  -     Comments  -  second full day of exericse  -     Duration  -  Continue with 45 min of aerobic exercise without signs/symptoms of physical distress.  -     Intensity  -  THRR unchanged  -       Progression   Progression  -  Continue to progress workloads to maintain intensity without signs/symptoms of physical distress.  -     Average METs  -  2.51  -       Resistance Training   Training Prescription  -  Yes  -     Weight  -  3 lb  -     Reps  -  10-15  -       Interval Training   Interval Training  -  No  -       Treadmill   MPH  -  2.3  -     Grade  -  0.5  -     Minutes  -  15  -     METs  -  2.92  -       REL-XR   Level  -  3  -     Minutes  -  15  -     METs  -  2.4  -       T5 Nustep   Level  -  2  -     Minutes  -  15  -     METs  -  2.2  -       Home Exercise Plan   Plans to continue exercise at  -  -  Home (comment) walking and stairs     Frequency  -  -  Add 3 additional days to program exercise sessions.     Initial Home Exercises Provided  -  -  12/18/17        Exercise Comments: Exercise Comments    Row Name 12/11/17 0736           Exercise Comments  First full day of exercise!  Patient was oriented to gym and equipment including functions, settings, policies, and procedures.  Patient's individual exercise prescription and treatment plan were reviewed.  All starting workloads were established based  on the results of the 6 minute walk  test done at initial orientation visit.  The plan for exercise progression was also introduced and progression will be customized based on patient's performance and goals.          Exercise Goals and Review: Exercise Goals    Row Name 12/05/17 1435             Exercise Goals   Increase Physical Activity  Yes       Intervention  Provide advice, education, support and counseling about physical activity/exercise needs.;Develop an individualized exercise prescription for aerobic and resistive training based on initial evaluation findings, risk stratification, comorbidities and participant's personal goals.       Expected Outcomes  Achievement of increased cardiorespiratory fitness and enhanced flexibility, muscular endurance and strength shown through measurements of functional capacity and personal statement of participant.       Increase Strength and Stamina  Yes       Intervention  Provide advice, education, support and counseling about physical activity/exercise needs.;Develop an individualized exercise prescription for aerobic and resistive training based on initial evaluation findings, risk stratification, comorbidities and participant's personal goals.       Expected Outcomes  Achievement of increased cardiorespiratory fitness and enhanced flexibility, muscular endurance and strength shown through measurements of functional capacity and personal statement of participant.       Able to understand and use rate of perceived exertion (RPE) scale  Yes       Intervention  Provide education and explanation on how to use RPE scale       Expected Outcomes  Short Term: Able to use RPE daily in rehab to express subjective intensity level;Long Term:  Able to use RPE to guide intensity level when exercising independently       Able to understand and use Dyspnea scale  Yes       Intervention  Provide education and explanation on how to use Dyspnea scale       Expected  Outcomes  Short Term: Able to use Dyspnea scale daily in rehab to express subjective sense of shortness of breath during exertion;Long Term: Able to use Dyspnea scale to guide intensity level when exercising independently       Knowledge and understanding of Target Heart Rate Range (THRR)  Yes       Intervention  Provide education and explanation of THRR including how the numbers were predicted and where they are located for reference       Expected Outcomes  Long Term: Able to use THRR to govern intensity when exercising independently;Short Term: Able to state/look up THRR;Short Term: Able to use daily as guideline for intensity in rehab       Able to check pulse independently  Yes       Intervention  Provide education and demonstration on how to check pulse in carotid and radial arteries.;Review the importance of being able to check your own pulse for safety during independent exercise       Expected Outcomes  Short Term: Able to explain why pulse checking is important during independent exercise;Long Term: Able to check pulse independently and accurately       Understanding of Exercise Prescription  Yes       Intervention  Provide education, explanation, and written materials on patient's individual exercise prescription       Expected Outcomes  Short Term: Able to explain program exercise prescription;Long Term: Able to explain home exercise prescription to exercise independently  Exercise Goals Re-Evaluation : Exercise Goals Re-Evaluation    Row Name 12/11/17 2831 12/16/17 1557 12/18/17 0847         Exercise Goal Re-Evaluation   Exercise Goals Review  Understanding of Exercise Prescription;Knowledge and understanding of Target Heart Rate Range (THRR);Able to understand and use rate of perceived exertion (RPE) scale  Increase Physical Activity;Increase Strength and Stamina;Understanding of Exercise Prescription  Increase Physical Activity;Increase Strength and Stamina;Understanding of  Exercise Prescription;Knowledge and understanding of Target Heart Rate Range (THRR);Able to understand and use rate of perceived exertion (RPE) scale;Able to check pulse independently     Comments  Reviewed RPE scale, THR and program prescription with pt today.  Pt voiced understanding and was given a copy of goals to take home.   Erickson is off to a good start in rehab.  He has completed two full days of exercise.  We will continue to monitor his progression.   Reviewed home exercise with pt today.  Pt plans to walk at home and stores for exercise.  Reviewed THR, pulse, RPE, sign and symptoms, NTG use, and when to call 911 or MD.  Also discussed weather considerations and indoor options.  Pt voiced understanding.     Expected Outcomes  Short: Use RPE daily to regulate intensity.  Long: Follow program prescription in THR.  Short: Continue to attend rehab regularly.  Long: Continue to follow program exercise prescription.   Short: Add in at least one extra day of home exercise.  Long: Continue to exercise more.         Discharge Exercise Prescription (Final Exercise Prescription Changes): Exercise Prescription Changes - 12/18/17 0800      Home Exercise Plan   Plans to continue exercise at  Home (comment) walking and stairs    Frequency  Add 3 additional days to program exercise sessions.    Initial Home Exercises Provided  12/18/17       Nutrition:  Target Goals: Understanding of nutrition guidelines, daily intake of sodium <15108m, cholesterol <2037m calories 30% from fat and 7% or less from saturated fats, daily to have 5 or more servings of fruits and vegetables.  Biometrics: Pre Biometrics - 12/05/17 1434      Pre Biometrics   Height  5' 6"  (1.676 m)    Weight  198 lb 8 oz (90 kg)    Waist Circumference  42 inches    Hip Circumference  42 inches    Waist to Hip Ratio  1 %    BMI (Calculated)  32.05    Single Leg Stand  20.66 seconds        Nutrition Therapy Plan and Nutrition  Goals: Nutrition Therapy & Goals - 12/05/17 1204      Intervention Plan   Intervention  Prescribe, educate and counsel regarding individualized specific dietary modifications aiming towards targeted core components such as weight, hypertension, lipid management, diabetes, heart failure and other comorbidities.    Expected Outcomes  Short Term Goal: Understand basic principles of dietary content, such as calories, fat, sodium, cholesterol and nutrients.;Short Term Goal: A plan has been developed with personal nutrition goals set during dietitian appointment.;Long Term Goal: Adherence to prescribed nutrition plan.       Nutrition Assessments: Nutrition Assessments - 12/05/17 1204      MEDFICTS Scores   Pre Score  24       Nutrition Goals Re-Evaluation:   Nutrition Goals Discharge (Final Nutrition Goals Re-Evaluation):   Psychosocial: Target Goals: Acknowledge presence or absence of  significant depression and/or stress, maximize coping skills, provide positive support system. Participant is able to verbalize types and ability to use techniques and skills needed for reducing stress and depression.   Initial Review & Psychosocial Screening: Initial Psych Review & Screening - 12/05/17 1437      Initial Review   Current issues with  Current Stress Concerns    Source of Stress Concerns  Occupation    Comments  Bucky's job is very stressful. 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 He knows that this job is very stressful and said each of his heart attacks has happened after a very stressful event so he knows he needs to be careful. He also reported that his wife's health is declining some and he knows his health is not getting better      [place]?  Yes wife, daughters here, sons in [place] and [place]      Screening Interventions   Interventions  Yes;Encouraged to exercise    Expected Outcomes  Short Term goal:  Utilizing psychosocial counselor, staff and physician to assist with identification of specific Stressors or current issues interfering with healing process. Setting desired goal for each stressor or current issue identified.;Long Term Goal: Stressors or current issues are controlled or eliminated.;Short Term goal: Identification and review with participant of any Quality of Life or Depression concerns found by scoring the questionnaire.;Long Term goal: The participant improves quality of Life and PHQ9 Scores as seen by post scores and/or verbalization of changes       Quality of Life Scores:  Quality of Life - 12/05/17 1204      Quality of Life Scores   Health/Function Pre  18.77 %    Socioeconomic Pre  23.83 %    Psych/Spiritual Pre  24.93 %    Family Pre  27.6 %    GLOBAL Pre  22.33 %      Scores of 19 and below usually indicate a poorer quality of life in these areas.  A difference of  2-3 points is a clinically meaningful difference.  A difference of 2-3 points in the total score of the Quality of Life Index has been associated with significant improvement in overall quality of life, self-image, physical symptoms, and general health in studies assessing change in quality of life.  PHQ-9: Recent Review Flowsheet Data    Depression screen [facility] 2/9 12/05/2017   Decreased Interest 1   Down, Depressed, Hopeless 0   PHQ - 2 Score 1   Altered sleeping 3   Tired, decreased energy 2   Change in appetite 1   Feeling bad or failure about yourself  0   Trouble concentrating 0   Moving slowly or fidgety/restless 0   Suicidal thoughts 0   PHQ-9 Score 7   Difficult doing work/chores Somewhat difficult     Interpretation of Total Score  Total Score Depression Severity:  1-4 = Minimal depression, 5-9 = Mild depression, 10-14 = Moderate depression, 15-19 = Moderately severe depression, 20-27 = Severe depression   Psychosocial Evaluation and Intervention: Psychosocial Evaluation - 12/11/17  0915      Psychosocial Evaluation & Interventions   Interventions  Encouraged to exercise with the program and follow exercise prescription;Relaxation education;Stress management education    Comments  Counselor met with Mr. Carn Mollenkopf) today for initial psychosocial evaluation.  He is a 67 year old who has an extensive history of  cardiac surgeries and stents inserted.  The last of which was a month ago with a heart attack and (3) stents.  He has a strong support system with a spouse of 44 years and (2) adult daughters here locally.  Kamran reports he has a chronic sleep problem with 1-3 hours per night mostly.  His Dr. had recommended a sleep study prior to the most recent cardiac event - but it has been postponed until he completes rehab.  Cruz has a good appetite and is trying to lose weight and learn portion control.  He denies a history of depression or anxiety or any current symptoms and is typically in a positive mood.  Remiel reports stress has been a major factor in his heart condition (per his Dr) and his job is a big contributor.  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   He has goals to lose some weight while in this program and learn healthier eating overall.  He also wants to get in the habit of exercise for his health - physically and mentally/emotionally.  Staff will be following with Daevion throughout the course of this program.    Expected Outcomes  Lory will benefit from consistent exercise to achieve his stated goals.  The educational and psychoeducational components of this program will be helpful in understanding and coping with his condition more positively.  He will meet with the dietician individually to address his weight loss goals.     Continue Psychosocial Services   Follow up required by staff       Psychosocial Re-Evaluation:   Psychosocial Discharge (Final Psychosocial  Re-Evaluation):   Vocational Rehabilitation: Provide vocational rehab assistance to qualifying candidates.   Vocational Rehab Evaluation & Intervention: Vocational Rehab - 12/05/17 1439      Initial Vocational Rehab Evaluation & Intervention   Assessment shows need for Vocational Rehabilitation  No       Education: Education Goals: Education classes will be provided on a variety of topics geared toward better understanding of heart health and risk factor modification. Participant will state understanding/return demonstration of topics presented as noted by education test scores.  Learning Barriers/Preferences: Learning Barriers/Preferences - 12/05/17 1439      Learning Barriers/Preferences   Learning Barriers  None    Learning Preferences  None       Education Topics:  AED/CPR: - Group verbal and written instruction with the use of models to demonstrate the basic use of the AED with the basic ABC's of resuscitation.   General Nutrition Guidelines/Fats and Fiber: -Group instruction provided by verbal, written material, models and posters to present the general guidelines for heart healthy nutrition. Gives an explanation and review of dietary fats and fiber.   Controlling Sodium/Reading Food Labels: -Group verbal and written material supporting the discussion of sodium use in heart healthy nutrition. Review and explanation with models, verbal and written materials for utilization of the food label.   Exercise Physiology & General Exercise Guidelines: - Group verbal and written instruction with models to review the exercise physiology of the cardiovascular system and associated critical values. Provides general exercise guidelines with specific guidelines to those with heart or lung disease.    Aerobic Exercise & Resistance Training: - Gives group verbal and written instruction on the various components of exercise. Focuses on aerobic and resistive training programs and the  benefits of this training and how to safely progress through these programs.Marland Kitchen  Flexibility, Balance, Mind/Body Relaxation: Provides group verbal/written instruction on the benefits of flexibility and balance training, including mind/body exercise modes such as yoga, pilates and tai chi.  Demonstration and skill practice provided.   Stress and Anxiety: - Provides group verbal and written instruction about the health risks of elevated stress and causes of high stress.  Discuss the correlation between heart/lung disease and anxiety and treatment options. Review healthy ways to manage with stress and anxiety.   Cardiac Rehab from 12/18/2017 in [facility] Cardiac and Pulmonary Rehab  Date  12/18/17  Educator  [facility]  Instruction Review Code  1- Verbalizes Understanding      Depression: - Provides group verbal and written instruction on the correlation between heart/lung disease and depressed mood, treatment options, and the stigmas associated with seeking treatment.   Anatomy & Physiology of the Heart: - Group verbal and written instruction and models provide basic cardiac anatomy and physiology, with the coronary electrical and arterial systems. Review of Valvular disease and Heart Failure   Cardiac Procedures: - Group verbal and written instruction to review commonly prescribed medications for heart disease. Reviews the medication, class of the drug, and side effects. Includes the steps to properly store meds and maintain the prescription regimen. (beta blockers and nitrates)   Cardiac Medications I: - Group verbal and written instruction to review commonly prescribed medications for heart disease. Reviews the medication, class of the drug, and side effects. Includes the steps to properly store meds and maintain the prescription regimen.   Cardiac Medications II: -Group verbal and written instruction to review commonly prescribed medications for heart disease. Reviews the medication, class of the  drug, and side effects. (all other drug classes)    Go Sex-Intimacy & Heart Disease, Get SMART - Goal Setting: - Group verbal and written instruction through game format to discuss heart disease and the return to sexual intimacy. Provides group verbal and written material to discuss and apply goal setting through the application of the S.M.A.R.T. Method.   Other Matters of the Heart: - Provides group verbal, written materials and models to describe Stable Angina and Peripheral Artery. Includes description of the disease process and treatment options available to the cardiac patient.   Exercise & Equipment Safety: - Individual verbal instruction and demonstration of equipment use and safety with use of the equipment.   Cardiac Rehab from 12/18/2017 in [facility] Cardiac and Pulmonary Rehab  Date  12/05/17  Educator  [facility]  Instruction Review Code  1- Verbalizes Understanding      Infection Prevention: - Provides verbal and written material to individual with discussion of infection control including proper hand washing and proper equipment cleaning during exercise session.   Cardiac Rehab from 12/18/2017 in [facility] Cardiac and Pulmonary Rehab  Date  12/05/17  Educator  [facility]  Instruction Review Code  1- Verbalizes Understanding      Falls Prevention: - Provides verbal and written material to individual with discussion of falls prevention and safety.   Cardiac Rehab from 12/18/2017 in [facility] Cardiac and Pulmonary Rehab  Date  12/05/17  Educator  [facility]  Instruction Review Code  1- Verbalizes Understanding      Diabetes: - Individual verbal and written instruction to review signs/symptoms of diabetes, desired ranges of glucose level fasting, after meals and with exercise. Acknowledge that pre and post exercise glucose checks will be done for 3 sessions at entry of program.   Cardiac Rehab from 12/18/2017 in [facility] Cardiac and Pulmonary Rehab  Date  12/05/17  Educator  [facility]  Instruction Review Code  1-  Verbalizes Understanding      Know Your Numbers and Risk Factors: -Group verbal and written instruction about important numbers in your health.  Discussion of what are risk factors and how they play a role in the disease process.  Review of Cholesterol, Blood Pressure, Diabetes, and BMI and the role they play in your overall health.   Sleep Hygiene: -Provides group verbal and written instruction about how sleep can affect your health.  Define sleep hygiene, discuss sleep cycles and impact of sleep habits. Review good sleep hygiene tips.    Other: -Provides group and verbal instruction on various topics (see comments)   Knowledge Questionnaire Score: Knowledge Questionnaire Score - 12/05/17 1207      Knowledge Questionnaire Score   Pre Score  23/28       Core Components/Risk Factors/Patient Goals at Admission: Personal Goals and Risk Factors at Admission - 12/05/17 1433      Core Components/Risk Factors/Patient Goals on Admission    Weight Management  Yes;Obesity;Weight Loss    Intervention  Weight Management: Develop a combined nutrition and exercise program designed to reach desired caloric intake, while maintaining appropriate intake of nutrient and fiber, sodium and fats, and appropriate energy expenditure required for the weight goal.;Weight Management: Provide education and appropriate resources to help participant work on and attain dietary goals.;Weight Management/Obesity: Establish reasonable short term and long term weight goals.;Obesity: Provide education and appropriate resources to help participant work on and attain dietary goals.    Admit Weight  198 lb 8 oz (90 kg)    Goal Weight: Short Term  194 lb (88 kg)    Goal Weight: Long Term  170 lb (77.1 kg)    Expected Outcomes  Short Term: Continue to assess and modify interventions until short term weight is achieved;Long Term: Adherence to nutrition and physical activity/exercise program aimed toward attainment of  established weight goal;Weight Loss: Understanding of general recommendations for a balanced deficit meal plan, which promotes 1-2 lb weight loss per week and includes a negative energy balance of [phone] kcal/d;Understanding recommendations for meals to include 15-35% energy as protein, 25-35% energy from fat, 35-60% energy from carbohydrates, less than 265m of dietary cholesterol, 20-35 gm of total fiber daily;Understanding of distribution of calorie intake throughout the day with the consumption of 4-5 meals/snacks    Diabetes  Yes    Intervention  Provide education about signs/symptoms and action to take for hypo/hyperglycemia.;Provide education about proper nutrition, including hydration, and aerobic/resistive exercise prescription along with prescribed medications to achieve blood glucose in normal ranges: Fasting glucose 65-99 mg/dL    Expected Outcomes  Short Term: Participant verbalizes understanding of the signs/symptoms and immediate care of hyper/hypoglycemia, proper foot care and importance of medication, aerobic/resistive exercise and nutrition plan for blood glucose control.;Long Term: Attainment of HbA1C < 7%.    Hypertension  Yes    Intervention  Provide education on lifestyle modifcations including regular physical activity/exercise, weight management, moderate sodium restriction and increased consumption of fresh fruit, vegetables, and low fat dairy, alcohol moderation, and smoking cessation.;Monitor prescription use compliance.    Expected Outcomes  Short Term: Continued assessment and intervention until BP is < 140/961mHG in hypertensive participants. < 130/8039mG in hypertensive participants with diabetes, heart failure or chronic kidney disease.;Long Term: Maintenance of blood pressure at goal levels.    Lipids  Yes    Intervention  Provide education and support for participant on nutrition & aerobic/resistive  exercise along with prescribed medications to achieve LDL <46m, HDL  >461m    Expected Outcomes  Short Term: Participant states understanding of desired cholesterol values and is compliant with medications prescribed. Participant is following exercise prescription and nutrition guidelines.;Long Term: Cholesterol controlled with medications as prescribed, with individualized exercise RX and with personalized nutrition plan. Value goals: LDL < 7033mHDL > 40 mg.    Stress  Yes Zacharee's job is very stressful. His wife's health is also declining some. His doctor told him 60% of his heart issues are caused by genetics which is discouraging to him.     Intervention  Offer individual and/or small group education and counseling on adjustment to heart disease, stress management and health-related lifestyle change. Teach and support self-help strategies.;Refer participants experiencing significant psychosocial distress to appropriate mental health specialists for further evaluation and treatment. When possible, include family members and significant others in education/counseling sessions.    Expected Outcomes  Short Term: Participant demonstrates changes in health-related behavior, relaxation and other stress management skills, ability to obtain effective social support, and compliance with psychotropic medications if prescribed.;Long Term: Emotional wellbeing is indicated by absence of clinically significant psychosocial distress or social isolation.       Core Components/Risk Factors/Patient Goals Review:    Core Components/Risk Factors/Patient Goals at Discharge (Final Review):    ITP Comments: ITP Comments    Row Name 12/05/17 1202 12/25/17 0601         ITP Comments  medical review completed. ITP sent to Dr MArElta GuadeloupelSabra Heckr review,changes as needed and signature   [facility]  30 Day review. Continue with ITP unless directed changes per Medical Director review. New to program.           Comments:

## 2017-12-27 ENCOUNTER — Encounter: Payer: Medicare Other | Attending: Internal Medicine

## 2017-12-27 DIAGNOSIS — I214 Non-ST elevation (NSTEMI) myocardial infarction: Secondary | ICD-10-CM | POA: Insufficient documentation

## 2017-12-30 ENCOUNTER — Encounter: Payer: Medicare Other | Admitting: *Deleted

## 2017-12-30 DIAGNOSIS — I214 Non-ST elevation (NSTEMI) myocardial infarction: Secondary | ICD-10-CM

## 2017-12-30 NOTE — Progress Notes (Signed)
Daily Session Note  Patient Details  Name: Peter Morales MRN: [id number] Date of Birth: Mar 22, 1951 Referring Provider:     Cardiac Rehab from 12/05/2017 in [facility] Cardiac and Pulmonary Rehab  Referring Provider  Tobe Sos      Encounter Date: 12/30/2017  Check In: Session Check In - 12/30/17 0809      Check-In   Location  [facility]-Cardiac & Pulmonary Rehab    Staff Present  Alberteen Sam, MA, RCEP, CCRP, Exercise Physiologist;Kelly Amedeo Plenty, BS, ACSM CEP, Exercise Physiologist;Susanne Bice, RN, BSN, CCRP    Supervising physician immediately available to respond to emergencies  See telemetry face sheet for immediately available ER MD    Medication changes reported      No    Fall or balance concerns reported     No    Warm-up and Cool-down  Performed on first and last piece of equipment    Resistance Training Performed  Yes    VAD Patient?  No      Pain Assessment   Currently in Pain?  No/denies    Multiple Pain Sites  No          Social History   Tobacco Use  Smoking Status Former Smoker  . Last attempt to quit: 07/23/1983  . Years since quitting: 34.4  Smokeless Tobacco Never Used    Goals Met:  Independence with exercise equipment Exercise tolerated well No report of cardiac concerns or symptoms Strength training completed today  Goals Unmet:  Not Applicable  Comments: Pt able to follow exercise prescription today without complaint.  Will continue to monitor for progression.    Dr. Emily Filbert is Medical Director for [place] and [facility].

## 2018-01-01 DIAGNOSIS — I214 Non-ST elevation (NSTEMI) myocardial infarction: Secondary | ICD-10-CM | POA: Diagnosis not present

## 2018-01-01 NOTE — Progress Notes (Signed)
Daily Session Note  Patient Details  Name: Peter Morales MRN: [id number] Date of Birth: 06/26/1951 Referring Provider:     Cardiac Rehab from 12/05/2017 in [facility] Cardiac and Pulmonary Rehab  Referring Provider  Tobe Sos      Encounter Date: 01/01/2018  Check In: Session Check In - 01/01/18 0759      Check-In   Location  [facility]-Cardiac & Pulmonary Rehab    Staff Present  Justin Mend RCP,RRT,BSRT;Heath Lark, RN, BSN, CCRP;Jessica Luan Pulling, MA, RCEP, CCRP, Exercise Physiologist    Supervising physician immediately available to respond to emergencies  See telemetry face sheet for immediately available ER MD    Medication changes reported      No    Fall or balance concerns reported     No    Warm-up and Cool-down  Performed on first and last piece of equipment    Resistance Training Performed  Yes    VAD Patient?  No      Pain Assessment   Currently in Pain?  No/denies          Social History   Tobacco Use  Smoking Status Former Smoker  . Last attempt to quit: 07/23/1983  . Years since quitting: 34.4  Smokeless Tobacco Never Used    Goals Met:  Independence with exercise equipment Exercise tolerated well No report of cardiac concerns or symptoms Strength training completed today  Goals Unmet:  Not Applicable  Comments: Pt able to follow exercise prescription today without complaint.  Will continue to monitor for progression.   Dr. Emily Filbert is Medical Director for [place] and [facility].

## 2018-01-03 DIAGNOSIS — I214 Non-ST elevation (NSTEMI) myocardial infarction: Secondary | ICD-10-CM | POA: Diagnosis not present

## 2018-01-03 NOTE — Progress Notes (Signed)
Daily Session Note  Patient Details  Name: Peter Morales MRN: [id number] Date of Birth: 06/14/1951 Referring Provider:     Cardiac Rehab from 12/05/2017 in [facility] Cardiac and Pulmonary Rehab  Referring Provider  Tobe Sos      Encounter Date: 01/03/2018  Check In: Session Check In - 01/03/18 0850      Check-In   Location  [facility]-Cardiac & Pulmonary Rehab    Staff Present  Alberteen Sam, MA, RCEP, CCRP, Exercise Physiologist;Meredith Sherryll Burger, RN Vickki Hearing, BA, ACSM CEP, Exercise Physiologist    Supervising physician immediately available to respond to emergencies  See telemetry face sheet for immediately available ER MD    Medication changes reported      No    Fall or balance concerns reported     No    Warm-up and Cool-down  Performed on first and last piece of equipment    Resistance Training Performed  Yes    VAD Patient?  No      Pain Assessment   Currently in Pain?  No/denies    Multiple Pain Sites  No          Social History   Tobacco Use  Smoking Status Former Smoker  . Last attempt to quit: 07/23/1983  . Years since quitting: 34.4  Smokeless Tobacco Never Used    Goals Met:  Independence with exercise equipment Exercise tolerated well No report of cardiac concerns or symptoms Strength training completed today  Goals Unmet:  Not Applicable  Comments: Pt able to follow exercise prescription today without complaint.  Will continue to monitor for progression.    Dr. Emily Filbert is Medical Director for [place] and [facility].

## 2018-01-06 ENCOUNTER — Encounter: Payer: Medicare Other | Admitting: *Deleted

## 2018-01-06 DIAGNOSIS — I214 Non-ST elevation (NSTEMI) myocardial infarction: Secondary | ICD-10-CM

## 2018-01-06 NOTE — Progress Notes (Signed)
Daily Session Note  Patient Details  Name: Sacha Radloff MRN: [id number] Date of Birth: 1951/07/31 Referring Provider:     Cardiac Rehab from 12/05/2017 in [facility] Cardiac and Pulmonary Rehab  Referring Provider  Tobe Sos      Encounter Date: 01/06/2018  Check In: Session Check In - 01/06/18 6015      Check-In   Location  [facility]-Cardiac & Pulmonary Rehab    Staff Present  Alberteen Sam, MA, RCEP, CCRP, Exercise Physiologist;Kelly Amedeo Plenty, BS, ACSM CEP, Exercise Physiologist;Susanne Bice, RN, BSN, CCRP    Supervising physician immediately available to respond to emergencies  See telemetry face sheet for immediately available ER MD    Medication changes reported      No    Fall or balance concerns reported     No    Warm-up and Cool-down  Performed on first and last piece of equipment    Resistance Training Performed  Yes    VAD Patient?  No      Pain Assessment   Currently in Pain?  No/denies          Social History   Tobacco Use  Smoking Status Former Smoker  . Last attempt to quit: 07/23/1983  . Years since quitting: 34.4  Smokeless Tobacco Never Used    Goals Met:  Independence with exercise equipment Exercise tolerated well No report of cardiac concerns or symptoms Strength training completed today  Goals Unmet:  Not Applicable  Comments: Pt able to follow exercise prescription today without complaint.  Will continue to monitor for progression.    Dr. Emily Filbert is Medical Director for [place] and [facility].

## 2018-01-08 DIAGNOSIS — I214 Non-ST elevation (NSTEMI) myocardial infarction: Secondary | ICD-10-CM

## 2018-01-08 NOTE — Progress Notes (Signed)
Daily Session Note  Patient Details  Name: Elizjah Noblet MRN: [id number] Date of Birth: 1951-02-20 Referring Provider:     Cardiac Rehab from 12/05/2017 in [facility] Cardiac and Pulmonary Rehab  Referring Provider  Tobe Sos      Encounter Date: 01/08/2018  Check In: Session Check In - 01/08/18 0731      Check-In   Location  [facility]-Cardiac & Pulmonary Rehab    Staff Present  Justin Mend RCP,RRT,BSRT;Heath Lark, RN, BSN, CCRP;Jessica Luan Pulling, MA, RCEP, CCRP, Exercise Physiologist    Supervising physician immediately available to respond to emergencies  See telemetry face sheet for immediately available ER MD    Medication changes reported      No    Fall or balance concerns reported     No    Warm-up and Cool-down  Performed on first and last piece of equipment    Resistance Training Performed  Yes    VAD Patient?  No      Pain Assessment   Currently in Pain?  No/denies          Social History   Tobacco Use  Smoking Status Former Smoker  . Last attempt to quit: 07/23/1983  . Years since quitting: 34.4  Smokeless Tobacco Never Used    Goals Met:  Independence with exercise equipment Exercise tolerated well No report of cardiac concerns or symptoms Strength training completed today  Goals Unmet:  Not Applicable  Comments: Pt able to follow exercise prescription today without complaint.  Will continue to monitor for progression.   Dr. Emily Filbert is Medical Director for [place] and [facility].

## 2018-01-10 ENCOUNTER — Encounter: Payer: Medicare Other | Admitting: *Deleted

## 2018-01-10 DIAGNOSIS — I214 Non-ST elevation (NSTEMI) myocardial infarction: Secondary | ICD-10-CM | POA: Diagnosis not present

## 2018-01-10 NOTE — Progress Notes (Signed)
Daily Session Note  Patient Details  Name: Peter Morales MRN: [id number] Date of Birth: 03/04/1951 Referring Provider:     Cardiac Rehab from 12/05/2017 in [facility] Cardiac and Pulmonary Rehab  Referring Provider  Brennan      Encounter Date: 01/10/2018  Check In: Session Check In - 01/10/18 0926      Check-In   Location  [facility]-Cardiac & Pulmonary Rehab    Staff Present  Jessica Hawkins, MA, RCEP, CCRP, Exercise Physiologist;Meredith Craven, RN BSN;Amanda Sommer, BA, ACSM CEP, Exercise Physiologist    Supervising physician immediately available to respond to emergencies  See telemetry face sheet for immediately available ER MD    Medication changes reported      No    Fall or balance concerns reported     No    Warm-up and Cool-down  Performed on first and last piece of equipment    Resistance Training Performed  Yes    VAD Patient?  No      Pain Assessment   Currently in Pain?  No/denies          Social History   Tobacco Use  Smoking Status Former Smoker  . Last attempt to quit: 07/23/1983  . Years since quitting: 34.4  Smokeless Tobacco Never Used    Goals Met:  Independence with exercise equipment Exercise tolerated well No report of cardiac concerns or symptoms Strength training completed today  Goals Unmet:  Not Applicable  Comments: Pt able to follow exercise prescription today without complaint.  Will continue to monitor for progression.    Dr. Mark Miller is Medical Director for [facility] and [facility]. 

## 2018-01-13 ENCOUNTER — Encounter: Payer: Medicare Other | Admitting: *Deleted

## 2018-01-13 DIAGNOSIS — I214 Non-ST elevation (NSTEMI) myocardial infarction: Secondary | ICD-10-CM

## 2018-01-13 NOTE — Progress Notes (Signed)
Daily Session Note  Patient Details  Name: Peter Morales MRN: [id number] Date of Birth: January 25, 1951 Referring Provider:     Cardiac Rehab from 12/05/2017 in [facility] Cardiac and Pulmonary Rehab  Referring Provider  Tobe Sos      Encounter Date: 01/13/2018  Check In: Session Check In - 01/13/18 0805      Check-In   Location  [facility]-Cardiac & Pulmonary Rehab    Staff Present  Alberteen Sam, MA, RCEP, CCRP, Exercise Physiologist;Linsey Hirota Amedeo Plenty, BS, ACSM CEP, Exercise Physiologist;Susanne Bice, RN, BSN, CCRP    Supervising physician immediately available to respond to emergencies  See telemetry face sheet for immediately available ER MD    Medication changes reported      No    Fall or balance concerns reported     No    Warm-up and Cool-down  Performed on first and last piece of equipment    Resistance Training Performed  Yes    VAD Patient?  No      Pain Assessment   Currently in Pain?  No/denies    Multiple Pain Sites  No          Social History   Tobacco Use  Smoking Status Former Smoker  . Last attempt to quit: 07/23/1983  . Years since quitting: 34.5  Smokeless Tobacco Never Used    Goals Met:  Independence with exercise equipment Exercise tolerated well No report of cardiac concerns or symptoms Strength training completed today  Goals Unmet:  Not Applicable  Comments: Pt able to follow exercise prescription today without complaint.  Will continue to monitor for progression.    Dr. Emily Filbert is Medical Director for [place] and [facility].

## 2018-01-17 ENCOUNTER — Encounter: Payer: Medicare Other | Admitting: *Deleted

## 2018-01-17 DIAGNOSIS — I214 Non-ST elevation (NSTEMI) myocardial infarction: Secondary | ICD-10-CM | POA: Diagnosis not present

## 2018-01-17 NOTE — Progress Notes (Signed)
Daily Session Note  Patient Details  Name: Waldon Sheerin MRN: [id number] Date of Birth: [birth year] Referring Provider:     Cardiac Rehab from 12/05/2017 in [facility] Cardiac and Pulmonary Rehab  Referring Provider  Tobe Sos      Encounter Date: 01/17/2018  Check In: Session Check In - 01/17/18 0849      Check-In   Location  [facility]-Cardiac & Pulmonary Rehab    Staff Present  Alberteen Sam, MA, RCEP, CCRP, Exercise Physiologist;Mandi Martha, BS, PEC;Meredith Sherryll Burger, RN BSN    Supervising physician immediately available to respond to emergencies  See telemetry face sheet for immediately available ER MD    Medication changes reported      No    Fall or balance concerns reported     No    Warm-up and Cool-down  Performed on first and last piece of equipment    Resistance Training Performed  Yes    VAD Patient?  No      Pain Assessment   Currently in Pain?  No/denies    Multiple Pain Sites  No          Social History   Tobacco Use  Smoking Status Former Smoker  . Last attempt to quit: 07/23/1983  . Years since quitting: 34.5  Smokeless Tobacco Never Used    Goals Met:  Independence with exercise equipment Exercise tolerated well No report of cardiac concerns or symptoms Strength training completed today  Goals Unmet:  Not Applicable  Comments: Pt able to follow exercise prescription today without complaint.  Will continue to monitor for progression.   Dr. Emily Filbert is Medical Director for [place] and [facility].

## 2018-01-22 ENCOUNTER — Encounter: Payer: Self-pay | Admitting: *Deleted

## 2018-01-22 DIAGNOSIS — I214 Non-ST elevation (NSTEMI) myocardial infarction: Secondary | ICD-10-CM | POA: Diagnosis not present

## 2018-01-22 NOTE — Progress Notes (Signed)
Daily Session Note  Patient Details  Name: Peter Morales MRN: [id number] Date of Birth: [birth year] Referring Provider:     Cardiac Rehab from 12/05/2017 in [facility] Cardiac and Pulmonary Rehab  Referring Provider  Tobe Sos      Encounter Date: 01/22/2018  Check In: Session Check In - 01/22/18 0736      Check-In   Location  [facility]-Cardiac & Pulmonary Rehab    Staff Present  Justin Mend RCP,RRT,BSRT;Heath Lark, RN, BSN, CCRP;Jessica Luan Pulling, MA, RCEP, CCRP, Exercise Physiologist    Supervising physician immediately available to respond to emergencies  See telemetry face sheet for immediately available ER MD    Medication changes reported      No    Fall or balance concerns reported     No    Warm-up and Cool-down  Performed on first and last piece of equipment    Resistance Training Performed  Yes    VAD Patient?  No      Pain Assessment   Currently in Pain?  No/denies          Social History   Tobacco Use  Smoking Status Former Smoker  . Last attempt to quit: 07/23/1983  . Years since quitting: 34.5  Smokeless Tobacco Never Used    Goals Met:  Independence with exercise equipment Exercise tolerated well No report of cardiac concerns or symptoms Strength training completed today  Goals Unmet:  Not Applicable  Comments: Pt able to follow exercise prescription today without complaint.  Will continue to monitor for progression.   Dr. Emily Filbert is Medical Director for [place] and [facility].

## 2018-01-22 NOTE — Progress Notes (Signed)
Cardiac Individual Treatment Plan  Patient Details  Name: Peter Morales MRN: [id number] Date of Birth: 1951/04/10 Referring Provider:     Cardiac Rehab from 12/05/2017 in [facility] Cardiac and Pulmonary Rehab  Referring Provider  Tobe Sos      Initial Encounter Date:    Cardiac Rehab from 12/05/2017 in [facility] Cardiac and Pulmonary Rehab  Date  12/05/17  Referring Provider  Tobe Sos      Visit Diagnosis: NSTEMI (non-ST elevated myocardial infarction) [facility])  Patient's Home Medications on Admission:  Current Outpatient Medications:  .  albuterol (PROVENTIL HFA;VENTOLIN HFA) 108 (90 Base) MCG/ACT inhaler, Inhale 2 puffs into the lungs every 6 (six) hours as needed for wheezing or shortness of breath., Disp: , Rfl:  .  aspirin EC 81 MG tablet, Take 81 mg by mouth daily., Disp: , Rfl:  .  atorvastatin (LIPITOR) 80 MG tablet, Take 80 mg by mouth daily., Disp: , Rfl:  .  azelastine (ASTELIN) 0.1 % nasal spray, Place 1 spray into both nostrils 2 (two) times daily. Use in each nostril as directed, Disp: , Rfl:  .  azelastine (OPTIVAR) 0.05 % ophthalmic solution, Place into both eyes 2 (two) times daily., Disp: , Rfl:  .  carvedilol (COREG) 12.5 MG tablet, Take 12.5 mg by mouth 2 (two) times daily with a meal., Disp: , Rfl:  .  cetirizine (ZYRTEC) 10 MG tablet, Take 10 mg by mouth daily., Disp: , Rfl:  .  clopidogrel (PLAVIX) 75 MG tablet, Take 75 mg by mouth daily., Disp: , Rfl:  .  cyclobenzaprine (FLEXERIL) 5 MG tablet, Take 5 mg by mouth 3 (three) times daily as needed for muscle spasms., Disp: , Rfl:  .  Diphenhydramine-APAP 25-500 MG TABS, Take 500 mg by mouth every evening., Disp: , Rfl:  .  fenofibrate (TRICOR) 145 MG tablet, Take 145 mg by mouth daily., Disp: , Rfl:  .  fexofenadine (ALLEGRA) 180 MG tablet, Take by mouth., Disp: , Rfl:  .  fluticasone (FLONASE) 50 MCG/ACT nasal spray, Place into the nose., Disp: , Rfl:  .  glipiZIDE (GLUCOTROL) 10 MG tablet, Take 10 mg by mouth daily before  breakfast., Disp: , Rfl:  .  isosorbide mononitrate (IMDUR) 30 MG 24 hr tablet, Take by mouth., Disp: , Rfl:  .  ketotifen (ZADITOR) 0.025 % ophthalmic solution, Apply to eye., Disp: , Rfl:  .  lisinopril (PRINIVIL,ZESTRIL) 10 MG tablet, Take 10 mg by mouth daily., Disp: , Rfl:  .  LORazepam (ATIVAN) 0.5 MG tablet, Take 0.5 mg by mouth every 8 (eight) hours. takr 1/2 tab as needed, Disp: , Rfl:  .  metFORMIN (GLUMETZA) 1000 MG (MOD) 24 hr tablet, Take 1,000 mg by mouth 2 (two) times daily with a meal., Disp: , Rfl:  .  montelukast (SINGULAIR) 10 MG tablet, Take by mouth., Disp: , Rfl:  .  nitroGLYCERIN (NITROSTAT) 0.4 MG SL tablet, Place 0.4 mg under the tongue every 5 (five) minutes as needed for chest pain., Disp: , Rfl:  .  pantoprazole (PROTONIX) 40 MG tablet, Take 40 mg by mouth daily., Disp: , Rfl:  .  Propylhexedrine (BENZEDREX) INHA, Place into the nose., Disp: , Rfl:  .  rosuvastatin (CRESTOR) 40 MG tablet, Take by mouth., Disp: , Rfl:  .  ticagrelor (BRILINTA) 90 MG TABS tablet, Take by mouth., Disp: , Rfl:   Past Medical History: Past Medical History:  Diagnosis Date  . Anxiety   . Coronary artery disease   . Diabetes mellitus without complication ([place])   .  Erectile dysfunction   . GERD (gastroesophageal reflux disease)   . Glaucoma   . Hyperlipidemia   . Hypertension   . Insomnia   . Myocardial infarct ([facility])     Tobacco Use: Social History   Tobacco Use  Smoking Status Former Smoker  . Last attempt to quit: 07/23/1983  . Years since quitting: 34.5  Smokeless Tobacco Never Used    Labs: Recent Review Flowsheet Data    There is no flowsheet data to display.       Exercise Target Goals:    Exercise Program Goal: Individual exercise prescription set using results from initial 6 min walk test and THRR while considering  patient's activity barriers and safety.   Exercise Prescription Goal: Initial exercise prescription builds to 30-45 minutes a day of aerobic  activity, 2-3 days per week.  Home exercise guidelines will be given to patient during program as part of exercise prescription that the participant will acknowledge.  Activity Barriers & Risk Stratification: Activity Barriers & Cardiac Risk Stratification - 12/05/17 1440      Activity Barriers & Cardiac Risk Stratification   Activity Barriers  Arthritis;Back Problems;Shortness of Breath;Chest Pain/Angina;Joint Problems    Cardiac Risk Stratification  High       6 Minute Walk: 6 Minute Walk    Row Name 12/05/17 1436         6 Minute Walk   Distance  1220 feet     Walk Time  6 minutes     # of Rest Breaks  0     MPH  2.31     METS  2.9     RPE  9     Perceived Dyspnea   0     VO2 Peak  10.2     Symptoms  No     Resting HR  81 bpm     Resting BP  118/62     Resting Oxygen Saturation   96 %     Exercise Oxygen Saturation  during 6 min walk  97 %     Max Ex. HR  116 bpm     Max Ex. BP  150/64     2 Minute Post BP  130/64        Oxygen Initial Assessment:   Oxygen Re-Evaluation:   Oxygen Discharge (Final Oxygen Re-Evaluation):   Initial Exercise Prescription: Initial Exercise Prescription - 12/05/17 1400      Date of Initial Exercise RX and Referring Provider   Date  12/05/17    Referring Provider  Tobe Sos      Treadmill   MPH  2.3    Grade  0.5    Minutes  15    METs  2.9      REL-XR   Level  3    Speed  50    Minutes  15    METs  2.9      T5 Nustep   Level  2    SPM  80    Minutes  15    METs  2.9      Prescription Details   Frequency (times per week)  3    Duration  Progress to 45 minutes of aerobic exercise without signs/symptoms of physical distress      Intensity   THRR 40-80% of Max Heartrate  109-137    Ratings of Perceived Exertion  11-13    Perceived Dyspnea  0-4      Resistance Training   Training Prescription  Yes    Weight  3 lb    Reps  10-15       Perform Capillary Blood Glucose checks as needed.  Exercise Prescription  Changes: Exercise Prescription Changes    Row Name 12/05/17 1400 12/16/17 1500 12/18/17 0800 01/01/18 1400 01/14/18 1000     Response to Exercise   Blood Pressure (Admit)  118/62  128/72  -  124/72  150/80   Blood Pressure (Exercise)  150/64  124/56  -  144/88  146/84   Blood Pressure (Exit)  130/64  120/70  -  142/80  126/60   Heart Rate (Admit)  80 bpm  85 bpm  -  88 bpm  97 bpm   Heart Rate (Exercise)  119 bpm  116 bpm  -  119 bpm  118 bpm   Heart Rate (Exit)  81 bpm  97 bpm  -  86 bpm  97 bpm   Oxygen Saturation (Admit)  96 %  -  -  -  -   Oxygen Saturation (Exit)  97 %  -  -  -  -   Rating of Perceived Exertion (Exercise)  9  12  -  14  14   Symptoms  -  none  -  none  none   Comments  -  second full day of exericse  -  -  -   Duration  -  Continue with 45 min of aerobic exercise without signs/symptoms of physical distress.  -  Continue with 45 min of aerobic exercise without signs/symptoms of physical distress.  Continue with 45 min of aerobic exercise without signs/symptoms of physical distress.   Intensity  -  THRR unchanged  -  THRR unchanged  THRR unchanged     Progression   Progression  -  Continue to progress workloads to maintain intensity without signs/symptoms of physical distress.  -  Continue to progress workloads to maintain intensity without signs/symptoms of physical distress.  Continue to progress workloads to maintain intensity without signs/symptoms of physical distress.   Average METs  -  2.51  -  3.19  3.19     Resistance Training   Training Prescription  -  Yes  -  Yes  Yes   Weight  -  3 lb  -  3 lb  4 lbs   Reps  -  10-15  -  10-15  10-15     Interval Training   Interval Training  -  No  -  No  No     Treadmill   MPH  -  2.3  -  2.3  2.5   Grade  -  0.5  -  1  1.5   Minutes  -  15  -  15  15   METs  -  2.92  -  3.17  3.43     REL-XR   Level  -  3  -  3  7   Minutes  -  15  -  15  15   METs  -  2.4  -  3.7  3.8     T5 Nustep   Level  -  2  -  2  3    Minutes  -  15  -  15  15   METs  -  2.2  -  3.4  2.5     Home Exercise Plan   Plans to continue exercise at  -  -  Home (comment) walking and stairs  Home (comment) walking and stairs  Home (comment) walking and stairs   Frequency  -  -  Add 3 additional days to program exercise sessions.  Add 3 additional days to program exercise sessions.  Add 3 additional days to program exercise sessions.   Initial Home Exercises Provided  -  -  12/18/17  12/18/17  12/18/17      Exercise Comments: Exercise Comments    Row Name 12/11/17 0736           Exercise Comments  First full day of exercise!  Patient was oriented to gym and equipment including functions, settings, policies, and procedures.  Patient's individual exercise prescription and treatment plan were reviewed.  All starting workloads were established based on the results of the 6 minute walk test done at initial orientation visit.  The plan for exercise progression was also introduced and progression will be customized based on patient's performance and goals.          Exercise Goals and Review: Exercise Goals    Row Name 12/05/17 1435             Exercise Goals   Increase Physical Activity  Yes       Intervention  Provide advice, education, support and counseling about physical activity/exercise needs.;Develop an individualized exercise prescription for aerobic and resistive training based on initial evaluation findings, risk stratification, comorbidities and participant's personal goals.       Expected Outcomes  Achievement of increased cardiorespiratory fitness and enhanced flexibility, muscular endurance and strength shown through measurements of functional capacity and personal statement of participant.       Increase Strength and Stamina  Yes       Intervention  Provide advice, education, support and counseling about physical activity/exercise needs.;Develop an individualized exercise prescription for aerobic and resistive  training based on initial evaluation findings, risk stratification, comorbidities and participant's personal goals.       Expected Outcomes  Achievement of increased cardiorespiratory fitness and enhanced flexibility, muscular endurance and strength shown through measurements of functional capacity and personal statement of participant.       Able to understand and use rate of perceived exertion (RPE) scale  Yes       Intervention  Provide education and explanation on how to use RPE scale       Expected Outcomes  Short Term: Able to use RPE daily in rehab to express subjective intensity level;Long Term:  Able to use RPE to guide intensity level when exercising independently       Able to understand and use Dyspnea scale  Yes       Intervention  Provide education and explanation on how to use Dyspnea scale       Expected Outcomes  Short Term: Able to use Dyspnea scale daily in rehab to express subjective sense of shortness of breath during exertion;Long Term: Able to use Dyspnea scale to guide intensity level when exercising independently       Knowledge and understanding of Target Heart Rate Range (THRR)  Yes       Intervention  Provide education and explanation of THRR including how the numbers were predicted and where they are located for reference       Expected Outcomes  Long Term: Able to use THRR to govern intensity when exercising independently;Short Term: Able to state/look up THRR;Short Term: Able to use daily as guideline for intensity in rehab  Able to check pulse independently  Yes       Intervention  Provide education and demonstration on how to check pulse in carotid and radial arteries.;Review the importance of being able to check your own pulse for safety during independent exercise       Expected Outcomes  Short Term: Able to explain why pulse checking is important during independent exercise;Long Term: Able to check pulse independently and accurately       Understanding of  Exercise Prescription  Yes       Intervention  Provide education, explanation, and written materials on patient's individual exercise prescription       Expected Outcomes  Short Term: Able to explain program exercise prescription;Long Term: Able to explain home exercise prescription to exercise independently          Exercise Goals Re-Evaluation : Exercise Goals Re-Evaluation    Row Name 12/11/17 0738 12/16/17 1557 12/18/17 0847 01/01/18 1455 01/03/18 0813     Exercise Goal Re-Evaluation   Exercise Goals Review  Understanding of Exercise Prescription;Knowledge and understanding of Target Heart Rate Range (THRR);Able to understand and use rate of perceived exertion (RPE) scale  Increase Physical Activity;Increase Strength and Stamina;Understanding of Exercise Prescription  Increase Physical Activity;Increase Strength and Stamina;Understanding of Exercise Prescription;Knowledge and understanding of Target Heart Rate Range (THRR);Able to understand and use rate of perceived exertion (RPE) scale;Able to check pulse independently  Increase Physical Activity;Understanding of Exercise Prescription;Increase Strength and Stamina  Increase Physical Activity;Increase Strength and Stamina;Able to understand and use rate of perceived exertion (RPE) scale   Comments  Reviewed RPE scale, THR and program prescription with pt today.  Pt voiced understanding and was given a copy of goals to take home.   Huber is off to a good start in rehab.  He has completed two full days of exercise.  We will continue to monitor his progression.   Reviewed home exercise with pt today.  Pt plans to walk at home and stores for exercise.  Reviewed THR, pulse, RPE, sign and symptoms, NTG use, and when to call 911 or MD.  Also discussed weather considerations and indoor options.  Pt voiced understanding.  Irby has been doing well in rehab.  He has been able to return to rehab after being out sick.  He has come back to his normal  workloads.  We will continue to monitor his progression.  Pt can tell he has less shortness of breath and his knees feel better going up stairs.  He walks while he is on the phone.     Expected Outcomes  Short: Use RPE daily to regulate intensity.  Long: Follow program prescription in THR.  Short: Continue to attend rehab regularly.  Long: Continue to follow program exercise prescription.   Short: Add in at least one extra day of home exercise.  Long: Continue to exercise more.   Short: Begin to increase workloads now that he is feeling better.  Long: Continue to exercise to build strength and stamina.   Short - Pt will continue to attend class to strengthen legs He will add some leg extensions if sitting for a long period  Long - Pt will build strength in legs and reduce knee pain long term   Row Name 01/14/18 1036             Exercise Goal Re-Evaluation   Exercise Goals Review  Increase Physical Activity;Increase Strength and Stamina;Able to understand and use rate of perceived exertion (RPE) scale  Comments  Francois has been doing well in rehab.  He is now up to level 7 on the XR.  We will continue to monitor his progression.        Expected Outcomes  Short: Continue to work increase workloads.  Long: Continue to work on leg strength          Discharge Exercise Prescription (Final Exercise Prescription Changes): Exercise Prescription Changes - 01/14/18 1000      Response to Exercise   Blood Pressure (Admit)  150/80    Blood Pressure (Exercise)  146/84    Blood Pressure (Exit)  126/60    Heart Rate (Admit)  97 bpm    Heart Rate (Exercise)  118 bpm    Heart Rate (Exit)  97 bpm    Rating of Perceived Exertion (Exercise)  14    Symptoms  none    Duration  Continue with 45 min of aerobic exercise without signs/symptoms of physical distress.    Intensity  THRR unchanged      Progression   Progression  Continue to progress workloads to maintain intensity without signs/symptoms of  physical distress.    Average METs  3.19      Resistance Training   Training Prescription  Yes    Weight  4 lbs    Reps  10-15      Interval Training   Interval Training  No      Treadmill   MPH  2.5    Grade  1.5    Minutes  15    METs  3.43      REL-XR   Level  7    Minutes  15    METs  3.8      T5 Nustep   Level  3    Minutes  15    METs  2.5      Home Exercise Plan   Plans to continue exercise at  Home (comment) walking and stairs    Frequency  Add 3 additional days to program exercise sessions.    Initial Home Exercises Provided  12/18/17       Nutrition:  Target Goals: Understanding of nutrition guidelines, daily intake of sodium <1534m, cholesterol <2018m calories 30% from fat and 7% or less from saturated fats, daily to have 5 or more servings of fruits and vegetables.  Biometrics: Pre Biometrics - 12/05/17 1434      Pre Biometrics   Height  5' 6"  (1.676 m)    Weight  198 lb 8 oz (90 kg)    Waist Circumference  42 inches    Hip Circumference  42 inches    Waist to Hip Ratio  1 %    BMI (Calculated)  32.05    Single Leg Stand  20.66 seconds        Nutrition Therapy Plan and Nutrition Goals: Nutrition Therapy & Goals - 01/08/18 0858      Nutrition Therapy   Diet  TLC    Drug/Food Interactions  Statins/Certain Fruits    Protein (specify units)  140g    Fiber  35 grams    Saturated Fats  15 max. grams    Fruits and Vegetables  5 servings/day    Sodium  1500 grams      Personal Nutrition Goals   Nutrition Goal  Swap your snacks. Try fruit, 1-2 portions of nuts, hummus and veggies, etc. in place of candy    Personal Goal #2  Increase fiber intake to help  you feel more full and satisifed. Choose larger portions of vegetables and proteins than starches    Personal Goal #3  Control portions based on the portion sizes discussed today    Comments  has DM type II but does not follow a diabetic diet, has tried to eat less sweets and baked goods however       Intervention Plan   Intervention  Prescribe, educate and counsel regarding individualized specific dietary modifications aiming towards targeted core components such as weight, hypertension, lipid management, diabetes, heart failure and other comorbidities.    Expected Outcomes  Short Term Goal: Understand basic principles of dietary content, such as calories, fat, sodium, cholesterol and nutrients.;Short Term Goal: A plan has been developed with personal nutrition goals set during dietitian appointment.;Long Term Goal: Adherence to prescribed nutrition plan.       Nutrition Assessments: Nutrition Assessments - 12/05/17 1204      MEDFICTS Scores   Pre Score  24       Nutrition Goals Re-Evaluation:   Nutrition Goals Discharge (Final Nutrition Goals Re-Evaluation):   Psychosocial: Target Goals: Acknowledge presence or absence of significant depression and/or stress, maximize coping skills, provide positive support system. Participant is able to verbalize types and ability to use techniques and skills needed for reducing stress and depression.   Initial Review & Psychosocial Screening: Initial Psych Review & Screening - 12/05/17 1437      Initial Review   Current issues with  Current Stress Concerns    Source of Stress Concerns  Occupation    Comments  Treyvonne's job is very stressful. 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 He knows that this job is very stressful and said each of his heart attacks has happened after a very stressful event so he knows he needs to be careful. He also reported that his wife's health is declining some and he knows his health is not getting better      [place]?  Yes wife, daughters here, sons in [place] and [place]      Screening Interventions   Interventions  Yes;Encouraged to exercise    Expected Outcomes  Short Term goal: Utilizing psychosocial counselor, staff and  physician to assist with identification of specific Stressors or current issues interfering with healing process. Setting desired goal for each stressor or current issue identified.;Long Term Goal: Stressors or current issues are controlled or eliminated.;Short Term goal: Identification and review with participant of any Quality of Life or Depression concerns found by scoring the questionnaire.;Long Term goal: The participant improves quality of Life and PHQ9 Scores as seen by post scores and/or verbalization of changes       Quality of Life Scores:  Quality of Life - 12/05/17 1204      Quality of Life Scores   Health/Function Pre  18.77 %    Socioeconomic Pre  23.83 %    Psych/Spiritual Pre  24.93 %    Family Pre  27.6 %    GLOBAL Pre  22.33 %      Scores of 19 and below usually indicate a poorer quality of life in these areas.  A difference of  2-3 points is a clinically meaningful difference.  A difference of 2-3 points in the total score of the Quality of Life Index has been associated with significant improvement in overall quality of life, self-image, physical symptoms, and general health in studies assessing change  in quality of life.  PHQ-9: Recent Review Flowsheet Data    Depression screen [facility] 2/9 12/05/2017   Decreased Interest 1   Down, Depressed, Hopeless 0   PHQ - 2 Score 1   Altered sleeping 3   Tired, decreased energy 2   Change in appetite 1   Feeling bad or failure about yourself  0   Trouble concentrating 0   Moving slowly or fidgety/restless 0   Suicidal thoughts 0   PHQ-9 Score 7   Difficult doing work/chores Somewhat difficult     Interpretation of Total Score  Total Score Depression Severity:  1-4 = Minimal depression, 5-9 = Mild depression, 10-14 = Moderate depression, 15-19 = Moderately severe depression, 20-27 = Severe depression   Psychosocial Evaluation and Intervention: Psychosocial Evaluation - 12/11/17 0915      Psychosocial Evaluation &  Interventions   Interventions  Encouraged to exercise with the program and follow exercise prescription;Relaxation education;Stress management education    Comments  Counselor met with Mr. Tolson Armwood) today for initial psychosocial evaluation.  He is a 67 year old who has an extensive history of cardiac surgeries and stents inserted.  The last of which was a month ago with a heart attack and (3) stents.  He has a strong support system with a spouse of 57 years and (2) adult daughters here locally.  Treshon reports he has a chronic sleep problem with 1-3 hours per night mostly.  His Dr. had recommended a sleep study prior to the most recent cardiac event - but it has been postponed until he completes rehab.  Syncere has a good appetite and is trying to lose weight and learn portion control.  He denies a history of depression or anxiety or any current symptoms and is typically in a positive mood.  Ford reports stress has been a major factor in his heart condition (per his Dr) and his job is a big contributor.  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   He has goals to lose some weight while in this program and learn healthier eating overall.  He also wants to get in the habit of exercise for his health - physically and mentally/emotionally.  Staff will be following with Cephas throughout the course of this program.    Expected Outcomes  Love will benefit from consistent exercise to achieve his stated goals.  The educational and psychoeducational components of this program will be helpful in understanding and coping with his condition more positively.  He will meet with the dietician individually to address his weight loss goals.     Continue Psychosocial Services   Follow up required by staff       Psychosocial Re-Evaluation: Psychosocial Re-Evaluation    [place] Name 01/03/18 0818             Psychosocial  Re-Evaluation   Current issues with  Current Stress Concerns       Comments  Pt states main stress is work.  He has started not taking laptop to bed and is working on not sleeping in [place].  He works long hours and with many countries in different time zones.       Expected Outcomes  Short - not sleeping in recliner, use relaxed breathing to reduce stress Long - Continue to set time boundaries for work and use breathing and exercise to manage stress       Interventions  Encouraged to attend Cardiac Rehabilitation for the exercise       Continue Psychosocial Services   Follow up required by staff          Psychosocial Discharge (Final Psychosocial Re-Evaluation): Psychosocial Re-Evaluation - 01/03/18 0818      Psychosocial Re-Evaluation   Current issues with  Current Stress Concerns    Comments  Pt states main stress is work.  He has started not taking laptop to bed and is working on not sleeping in [place].  He works long hours and with many countries in different time zones.    Expected Outcomes  Short - not sleeping in recliner, use relaxed breathing to reduce stress Long - Continue to set time boundaries for work and use breathing and exercise to manage stress    Interventions  Encouraged to attend Cardiac Rehabilitation for the exercise    Continue Psychosocial Services   Follow up required by staff       Vocational Rehabilitation: Provide vocational rehab assistance to qualifying candidates.   Vocational Rehab Evaluation & Intervention: Vocational Rehab - 12/05/17 1439      Initial Vocational Rehab Evaluation & Intervention   Assessment shows need for Vocational Rehabilitation  No       Education: Education Goals: Education classes will be provided on a variety of topics geared toward better understanding of heart health and risk factor modification. Participant will state understanding/return demonstration of topics presented as noted by education test scores.  Learning  Barriers/Preferences: Learning Barriers/Preferences - 12/05/17 1439      Learning Barriers/Preferences   Learning Barriers  None    Learning Preferences  None       Education Topics:  AED/CPR: - Group verbal and written instruction with the use of models to demonstrate the basic use of the AED with the basic ABC's of resuscitation.   Cardiac Rehab from 01/13/2018 in [facility] Cardiac and Pulmonary Rehab  Date  01/06/18  Educator  SB  Instruction Review Code  1- Verbalizes Understanding      General Nutrition Guidelines/Fats and Fiber: -Group instruction provided by verbal, written material, models and posters to present the general guidelines for heart healthy nutrition. Gives an explanation and review of dietary fats and fiber.   Controlling Sodium/Reading Food Labels: -Group verbal and written material supporting the discussion of sodium use in heart healthy nutrition. Review and explanation with models, verbal and written materials for utilization of the food label.   Cardiac Rehab from 01/13/2018 in [facility] Cardiac and Pulmonary Rehab  Date  12/30/17  Educator  PI  Instruction Review Code  1- Verbalizes Understanding      Exercise Physiology & General Exercise Guidelines: - Group verbal and written instruction with models to review the exercise physiology of the cardiovascular system and associated critical values. Provides general exercise guidelines with specific guidelines to those with heart or lung disease.    Cardiac Rehab from 01/13/2018 in [facility] Cardiac and Pulmonary Rehab  Date  01/13/18  Educator  [facility]  Instruction Review Code  1- Verbalizes Understanding      Aerobic Exercise & Resistance Training: - Gives group verbal and written instruction on the various components of exercise. Focuses on aerobic and resistive training programs and the benefits of this training and how to safely progress through these programs..   Flexibility, Balance, Mind/Body Relaxation: Provides  group verbal/written instruction on the benefits of flexibility and balance training, including mind/body exercise modes such as yoga, pilates and tai chi.  Demonstration and  skill practice provided.   Stress and Anxiety: - Provides group verbal and written instruction about the health risks of elevated stress and causes of high stress.  Discuss the correlation between heart/lung disease and anxiety and treatment options. Review healthy ways to manage with stress and anxiety.   Cardiac Rehab from 01/13/2018 in [facility] Cardiac and Pulmonary Rehab  Date  12/18/17  Educator  [facility]  Instruction Review Code  1- Verbalizes Understanding      Depression: - Provides group verbal and written instruction on the correlation between heart/lung disease and depressed mood, treatment options, and the stigmas associated with seeking treatment.   Anatomy & Physiology of the Heart: - Group verbal and written instruction and models provide basic cardiac anatomy and physiology, with the coronary electrical and arterial systems. Review of Valvular disease and Heart Failure   Cardiac Procedures: - Group verbal and written instruction to review commonly prescribed medications for heart disease. Reviews the medication, class of the drug, and side effects. Includes the steps to properly store meds and maintain the prescription regimen. (beta blockers and nitrates)   Cardiac Medications I: - Group verbal and written instruction to review commonly prescribed medications for heart disease. Reviews the medication, class of the drug, and side effects. Includes the steps to properly store meds and maintain the prescription regimen.   Cardiac Medications II: -Group verbal and written instruction to review commonly prescribed medications for heart disease. Reviews the medication, class of the drug, and side effects. (all other drug classes)    Go Sex-Intimacy & Heart Disease, Get SMART - Goal Setting: - Group verbal and  written instruction through game format to discuss heart disease and the return to sexual intimacy. Provides group verbal and written material to discuss and apply goal setting through the application of the S.M.A.R.T. Method.   Other Matters of the Heart: - Provides group verbal, written materials and models to describe Stable Angina and Peripheral Artery. Includes description of the disease process and treatment options available to the cardiac patient.   Exercise & Equipment Safety: - Individual verbal instruction and demonstration of equipment use and safety with use of the equipment.   Cardiac Rehab from 01/13/2018 in [facility] Cardiac and Pulmonary Rehab  Date  12/05/17  Educator  [facility]  Instruction Review Code  1- Verbalizes Understanding      Infection Prevention: - Provides verbal and written material to individual with discussion of infection control including proper hand washing and proper equipment cleaning during exercise session.   Cardiac Rehab from 01/13/2018 in [facility] Cardiac and Pulmonary Rehab  Date  12/05/17  Educator  [facility]  Instruction Review Code  1- Verbalizes Understanding      Falls Prevention: - Provides verbal and written material to individual with discussion of falls prevention and safety.   Cardiac Rehab from 01/13/2018 in [facility] Cardiac and Pulmonary Rehab  Date  12/05/17  Educator  [facility]  Instruction Review Code  1- Verbalizes Understanding      Diabetes: - Individual verbal and written instruction to review signs/symptoms of diabetes, desired ranges of glucose level fasting, after meals and with exercise. Acknowledge that pre and post exercise glucose checks will be done for 3 sessions at entry of program.   Cardiac Rehab from 01/13/2018 in [facility] Cardiac and Pulmonary Rehab  Date  12/05/17  Educator  [facility]  Instruction Review Code  1- Verbalizes Understanding      Know Your Numbers and Risk Factors: -Group verbal and written instruction about important numbers  in  your health.  Discussion of what are risk factors and how they play a role in the disease process.  Review of Cholesterol, Blood Pressure, Diabetes, and BMI and the role they play in your overall health.   Sleep Hygiene: -Provides group verbal and written instruction about how sleep can affect your health.  Define sleep hygiene, discuss sleep cycles and impact of sleep habits. Review good sleep hygiene tips.    Cardiac Rehab from 01/13/2018 in [facility] Cardiac and Pulmonary Rehab  Date  01/01/18  Educator  [facility]  Instruction Review Code  1- Verbalizes Understanding      Other: -Provides group and verbal instruction on various topics (see comments)   Knowledge Questionnaire Score: Knowledge Questionnaire Score - 12/05/17 1207      Knowledge Questionnaire Score   Pre Score  23/28       Core Components/Risk Factors/Patient Goals at Admission: Personal Goals and Risk Factors at Admission - 12/05/17 1433      Core Components/Risk Factors/Patient Goals on Admission    Weight Management  Yes;Obesity;Weight Loss    Intervention  Weight Management: Develop a combined nutrition and exercise program designed to reach desired caloric intake, while maintaining appropriate intake of nutrient and fiber, sodium and fats, and appropriate energy expenditure required for the weight goal.;Weight Management: Provide education and appropriate resources to help participant work on and attain dietary goals.;Weight Management/Obesity: Establish reasonable short term and long term weight goals.;Obesity: Provide education and appropriate resources to help participant work on and attain dietary goals.    Admit Weight  198 lb 8 oz (90 kg)    Goal Weight: Short Term  194 lb (88 kg)    Goal Weight: Long Term  170 lb (77.1 kg)    Expected Outcomes  Short Term: Continue to assess and modify interventions until short term weight is achieved;Long Term: Adherence to nutrition and physical activity/exercise program aimed toward  attainment of established weight goal;Weight Loss: Understanding of general recommendations for a balanced deficit meal plan, which promotes 1-2 lb weight loss per week and includes a negative energy balance of [phone] kcal/d;Understanding recommendations for meals to include 15-35% energy as protein, 25-35% energy from fat, 35-60% energy from carbohydrates, less than 268m of dietary cholesterol, 20-35 gm of total fiber daily;Understanding of distribution of calorie intake throughout the day with the consumption of 4-5 meals/snacks    Diabetes  Yes    Intervention  Provide education about signs/symptoms and action to take for hypo/hyperglycemia.;Provide education about proper nutrition, including hydration, and aerobic/resistive exercise prescription along with prescribed medications to achieve blood glucose in normal ranges: Fasting glucose 65-99 mg/dL    Expected Outcomes  Short Term: Participant verbalizes understanding of the signs/symptoms and immediate care of hyper/hypoglycemia, proper foot care and importance of medication, aerobic/resistive exercise and nutrition plan for blood glucose control.;Long Term: Attainment of HbA1C < 7%.    Hypertension  Yes    Intervention  Provide education on lifestyle modifcations including regular physical activity/exercise, weight management, moderate sodium restriction and increased consumption of fresh fruit, vegetables, and low fat dairy, alcohol moderation, and smoking cessation.;Monitor prescription use compliance.    Expected Outcomes  Short Term: Continued assessment and intervention until BP is < 140/961mHG in hypertensive participants. < 130/8060mG in hypertensive participants with diabetes, heart failure or chronic kidney disease.;Long Term: Maintenance of blood pressure at goal levels.    Lipids  Yes    Intervention  Provide education and support for participant on nutrition &  aerobic/resistive exercise along with prescribed medications to achieve LDL  <48m, HDL >453m    Expected Outcomes  Short Term: Participant states understanding of desired cholesterol values and is compliant with medications prescribed. Participant is following exercise prescription and nutrition guidelines.;Long Term: Cholesterol controlled with medications as prescribed, with individualized exercise RX and with personalized nutrition plan. Value goals: LDL < 7049mHDL > 40 mg.    Stress  Yes Nicodemus's job is very stressful. His wife's health is also declining some. His doctor told him 60% of his heart issues are caused by genetics which is discouraging to him.     Intervention  Offer individual and/or small group education and counseling on adjustment to heart disease, stress management and health-related lifestyle change. Teach and support self-help strategies.;Refer participants experiencing significant psychosocial distress to appropriate mental health specialists for further evaluation and treatment. When possible, include family members and significant others in education/counseling sessions.    Expected Outcomes  Short Term: Participant demonstrates changes in health-related behavior, relaxation and other stress management skills, ability to obtain effective social support, and compliance with psychotropic medications if prescribed.;Long Term: Emotional wellbeing is indicated by absence of clinically significant psychosocial distress or social isolation.       Core Components/Risk Factors/Patient Goals Review:  Goals and Risk Factor Review    Row Name 01/03/18 0809             Core Components/Risk Factors/Patient Goals Review   Personal Goals Review  Weight Management/Obesity;Diabetes;Hypertension;Lipids       Review  PieShadiports his A1C stays steady and he doesnt experience high or low BG.  He is scheduled to meet with the RD 2/13.  He eats a medCamera operatoret.  He can tell a differnec in going up stairs (much easier) and less SOB.         Expected Outcomes   Short - Pt will meet with RD and Make adjustments to improve his diet.  Long - Pt will control DM and A1C long term          Core Components/Risk Factors/Patient Goals at Discharge (Final Review):  Goals and Risk Factor Review - 01/03/18 0809      Core Components/Risk Factors/Patient Goals Review   Personal Goals Review  Weight Management/Obesity;Diabetes;Hypertension;Lipids    Review  PieJessupports his A1C stays steady and he doesnt experience high or low BG.  He is scheduled to meet with the RD 2/13.  He eats a medCamera operatoret.  He can tell a differnec in going up stairs (much easier) and less SOB.      Expected Outcomes  Short - Pt will meet with RD and Make adjustments to improve his diet.  Long - Pt will control DM and A1C long term       ITP Comments: ITP Comments    Row Name 12/05/17 1202 12/25/17 0601 01/22/18 0645       ITP Comments  medical review completed. ITP sent to Dr MArElta GuadeloupelSabra Heckr review,changes as needed and signature   [facility] of [facility]  30 Day review. Continue with ITP unless directed changes per Medical Director review. New to program.    30 day review completed. Continue with ITP unless diercted changes per Medical Director.        Comments:

## 2018-01-24 ENCOUNTER — Encounter: Payer: Medicare Other | Attending: Internal Medicine

## 2018-01-24 DIAGNOSIS — I214 Non-ST elevation (NSTEMI) myocardial infarction: Secondary | ICD-10-CM | POA: Insufficient documentation

## 2018-01-27 ENCOUNTER — Encounter: Payer: Medicare Other | Admitting: *Deleted

## 2018-01-27 DIAGNOSIS — I214 Non-ST elevation (NSTEMI) myocardial infarction: Secondary | ICD-10-CM

## 2018-01-27 NOTE — Progress Notes (Signed)
Daily Session Note  Patient Details  Name: Peter Morales MRN: [id number] Date of Birth: 05-02-1951 Referring Provider:     Cardiac Rehab from 12/05/2017 in [facility] Cardiac and Pulmonary Rehab  Referring Provider  Tobe Sos      Encounter Date: 01/27/2018  Check In: Session Check In - 01/27/18 0807      Check-In   Location  [facility]-Cardiac & Pulmonary Rehab    Staff Present  Alberteen Sam, MA, RCEP, CCRP, Exercise Physiologist;Delisia Mcquiston Amedeo Plenty, BS, ACSM CEP, Exercise Physiologist;Susanne Bice, RN, BSN, CCRP    Supervising physician immediately available to respond to emergencies  See telemetry face sheet for immediately available ER MD    Medication changes reported      No    Fall or balance concerns reported     No    Warm-up and Cool-down  Performed on first and last piece of equipment    Resistance Training Performed  Yes    VAD Patient?  No      Pain Assessment   Currently in Pain?  No/denies    Multiple Pain Sites  No          Social History   Tobacco Use  Smoking Status Former Smoker  . Last attempt to quit: 07/23/1983  . Years since quitting: 34.5  Smokeless Tobacco Never Used    Goals Met:  Independence with exercise equipment Exercise tolerated well No report of cardiac concerns or symptoms Strength training completed today  Goals Unmet:  Not Applicable  Comments: Pt able to follow exercise prescription today without complaint.  Will continue to monitor for progression.    Dr. Emily Filbert is Medical Director for [place] and [facility].

## 2018-01-29 DIAGNOSIS — I214 Non-ST elevation (NSTEMI) myocardial infarction: Secondary | ICD-10-CM | POA: Diagnosis not present

## 2018-01-29 NOTE — Progress Notes (Signed)
Daily Session Note  Patient Details  Name: Peter Morales MRN: [id number] Date of Birth: 12/22/1950 Referring Provider:     Cardiac Rehab from 12/05/2017 in [facility] Cardiac and Pulmonary Rehab  Referring Provider  Brennan      Encounter Date: 01/29/2018  Check In: Session Check In - 01/29/18 0735      Check-In   Location  [facility]-Cardiac & Pulmonary Rehab    Staff Present  Jessica Hawkins, MA, RCEP, CCRP, Exercise Physiologist;Joseph Hood RCP,RRT,BSRT;Susanne Bice, RN, BSN, CCRP    Supervising physician immediately available to respond to emergencies  See telemetry face sheet for immediately available ER MD    Medication changes reported      No    Fall or balance concerns reported     No    Warm-up and Cool-down  Performed on first and last piece of equipment    Resistance Training Performed  Yes    VAD Patient?  No      Pain Assessment   Currently in Pain?  No/denies          Social History   Tobacco Use  Smoking Status Former Smoker  . Last attempt to quit: 07/23/1983  . Years since quitting: 34.5  Smokeless Tobacco Never Used    Goals Met:  Independence with exercise equipment Exercise tolerated well No report of cardiac concerns or symptoms Strength training completed today  Goals Unmet:  Not Applicable  Comments: Pt able to follow exercise prescription today without complaint.  Will continue to monitor for progression.   Dr. Mark Miller is Medical Director for [facility] and [facility]. 

## 2018-01-31 ENCOUNTER — Encounter: Payer: Medicare Other | Admitting: *Deleted

## 2018-01-31 DIAGNOSIS — I214 Non-ST elevation (NSTEMI) myocardial infarction: Secondary | ICD-10-CM | POA: Diagnosis not present

## 2018-01-31 NOTE — Progress Notes (Signed)
Daily Session Note  Patient Details  Name: Peter Morales MRN: [id number] Date of Birth: 01-11-1951 Referring Provider:     Cardiac Rehab from 12/05/2017 in [facility] Cardiac and Pulmonary Rehab  Referring Provider  Tobe Sos      Encounter Date: 01/31/2018  Check In: Session Check In - 01/31/18 0804      Check-In   Location  [facility]-Cardiac & Pulmonary Rehab    Staff Present  Alberteen Sam, MA, RCEP, CCRP, Exercise Physiologist;Meredith Sherryll Burger, RN Vickki Hearing, BA, ACSM CEP, Exercise Physiologist    Supervising physician immediately available to respond to emergencies  See telemetry face sheet for immediately available ER MD    Medication changes reported      No    Fall or balance concerns reported     No    Warm-up and Cool-down  Performed on first and last piece of equipment    Resistance Training Performed  Yes    VAD Patient?  No      Pain Assessment   Currently in Pain?  No/denies          Social History   Tobacco Use  Smoking Status Former Smoker  . Last attempt to quit: 07/23/1983  . Years since quitting: 34.5  Smokeless Tobacco Never Used    Goals Met:  Independence with exercise equipment Exercise tolerated well Personal goals reviewed No report of cardiac concerns or symptoms Strength training completed today  Goals Unmet:  Not Applicable  Comments: Pt able to follow exercise prescription today without complaint.  Will continue to monitor for progression.    Dr. Emily Filbert is Medical Director for [place] and [facility].

## 2018-02-03 ENCOUNTER — Encounter: Payer: Medicare Other | Admitting: *Deleted

## 2018-02-03 DIAGNOSIS — I214 Non-ST elevation (NSTEMI) myocardial infarction: Secondary | ICD-10-CM | POA: Diagnosis not present

## 2018-02-03 NOTE — Progress Notes (Signed)
Daily Session Note  Patient Details  Name: Peter Morales MRN: [id number] Date of Birth: 20-Jan-1951 Referring Provider:     Cardiac Rehab from 12/05/2017 in [facility] Cardiac and Pulmonary Rehab  Referring Provider  Tobe Sos      Encounter Date: 02/03/2018  Check In: Session Check In - 02/03/18 0757      Check-In   Location  [facility]-Cardiac & Pulmonary Rehab    Staff Present  Alberteen Sam, MA, RCEP, CCRP, Exercise Physiologist;Kelly Amedeo Plenty, BS, ACSM CEP, Exercise Physiologist;Susanne Bice, RN, BSN, CCRP    Supervising physician immediately available to respond to emergencies  See telemetry face sheet for immediately available ER MD    Medication changes reported      No    Fall or balance concerns reported     No    Warm-up and Cool-down  Performed on first and last piece of equipment    Resistance Training Performed  Yes    VAD Patient?  No      Pain Assessment   Currently in Pain?  No/denies          Social History   Tobacco Use  Smoking Status Former Smoker  . Last attempt to quit: 07/23/1983  . Years since quitting: 34.5  Smokeless Tobacco Never Used    Goals Met:  Independence with exercise equipment Exercise tolerated well No report of cardiac concerns or symptoms Strength training completed today  Goals Unmet:  Not Applicable  Comments: Pt able to follow exercise prescription today without complaint.  Will continue to monitor for progression.    Dr. Emily Filbert is Medical Director for [place] and [facility].

## 2018-02-05 DIAGNOSIS — I214 Non-ST elevation (NSTEMI) myocardial infarction: Secondary | ICD-10-CM | POA: Diagnosis not present

## 2018-02-05 NOTE — Progress Notes (Signed)
Daily Session Note  Patient Details  Name: Peter Morales MRN: [id number] Date of Birth: August 29, 1951 Referring Provider:     Cardiac Rehab from 12/05/2017 in [facility] Cardiac and Pulmonary Rehab  Referring Provider  Tobe Sos      Encounter Date: 02/05/2018  Check In: Session Check In - 02/05/18 0744      Check-In   Location  [facility]-Cardiac & Pulmonary Rehab    Staff Present  Alberteen Sam, MA, RCEP, CCRP, Exercise Physiologist;Saphronia Ozdemir [place];Heath Lark, RN, BSN, CCRP    Supervising physician immediately available to respond to emergencies  See telemetry face sheet for immediately available ER MD    Medication changes reported      No    Fall or balance concerns reported     No    Warm-up and Cool-down  Performed on first and last piece of equipment    Resistance Training Performed  Yes    VAD Patient?  No      Pain Assessment   Currently in Pain?  No/denies          Social History   Tobacco Use  Smoking Status Former Smoker  . Last attempt to quit: 07/23/1983  . Years since quitting: 34.5  Smokeless Tobacco Never Used    Goals Met:  Independence with exercise equipment Exercise tolerated well No report of cardiac concerns or symptoms Strength training completed today  Goals Unmet:  Not Applicable  Comments: Pt able to follow exercise prescription today without complaint.  Will continue to monitor for progression.   Dr. Emily Filbert is Medical Director for [place] and [facility].

## 2018-02-07 DIAGNOSIS — I214 Non-ST elevation (NSTEMI) myocardial infarction: Secondary | ICD-10-CM | POA: Diagnosis not present

## 2018-02-07 NOTE — Progress Notes (Signed)
Daily Session Note  Patient Details  Name: Peter Morales MRN: [id number] Date of Birth: 10-12-1951 Referring Provider:     Cardiac Rehab from 12/05/2017 in [facility] Cardiac and Pulmonary Rehab  Referring Provider  Tobe Sos      Encounter Date: 02/07/2018  Check In: Session Check In - 02/07/18 0756      Check-In   Location  [facility]-Cardiac & Pulmonary Rehab    Staff Present  Joellyn Rued, BS, PEC;Meredith [place], RN Vickki Hearing, [place], ACSM CEP, Exercise Physiologist    Supervising physician immediately available to respond to emergencies  See telemetry face sheet for immediately available ER MD    Medication changes reported      No    Fall or balance concerns reported     No    Warm-up and Cool-down  Performed on first and last piece of equipment    Resistance Training Performed  Yes    VAD Patient?  No      Pain Assessment   Currently in Pain?  No/denies    Multiple Pain Sites  No          Social History   Tobacco Use  Smoking Status Former Smoker  . Last attempt to quit: 07/23/1983  . Years since quitting: 34.5  Smokeless Tobacco Never Used    Goals Met:  Independence with exercise equipment Exercise tolerated well No report of cardiac concerns or symptoms Strength training completed today  Goals Unmet:  Not Applicable  Comments: Pt able to follow exercise prescription today without complaint.  Will continue to monitor for progression.    Dr. Emily Filbert is Medical Director for [place] and [facility].

## 2018-02-10 DIAGNOSIS — J449 Chronic obstructive pulmonary disease, unspecified: Secondary | ICD-10-CM | POA: Insufficient documentation

## 2018-02-12 VITALS — Ht 66.0 in | Wt 201.0 lb

## 2018-02-12 DIAGNOSIS — I214 Non-ST elevation (NSTEMI) myocardial infarction: Secondary | ICD-10-CM

## 2018-02-12 NOTE — Progress Notes (Signed)
Daily Session Note  Patient Details  Name: Peter Morales MRN: [id number] Date of Birth: 1951-07-27 Referring Provider:     Cardiac Rehab from 12/05/2017 in [facility]  Referring Provider  Tobe Sos      Encounter Date: 02/12/2018  Check In: Session Check In - 02/12/18 0803      Check-In   Location  [facility]    Staff Present  Justin Mend RCP,RRT,BSRT;Heath Lark, RN, BSN, CCRP;Jessica Luan Pulling, MA, RCEP, CCRP, Exercise Physiologist    Supervising physician immediately available to respond to emergencies  See telemetry face sheet for immediately available ER MD    Medication changes reported      No    Fall or balance concerns reported     No    Tobacco Cessation  No Change    Warm-up and Cool-down  Performed on first and last piece of equipment    Resistance Training Performed  Yes    VAD Patient?  No      Pain Assessment   Currently in Pain?  No/denies          Social History   Tobacco Use  Smoking Status Former Smoker  . Last attempt to quit: 07/23/1983  . Years since quitting: 34.5  Smokeless Tobacco Never Used    Goals Met:  Independence with exercise equipment Exercise tolerated well No report of cardiac concerns or symptoms Strength training completed today  Goals Unmet:  Not Applicable  Comments: Pt able to follow exercise prescription today without complaint.  Will continue to monitor for progression. [place] Name 12/05/17 1436 02/12/18 0852       6 Minute Walk   Phase  -  Discharge    Distance  1220 feet  1620 feet    Distance % Change  -  32.8 %    Distance Feet Change  -  400 ft    Walk Time  6 minutes  6 minutes    # of Rest Breaks  0  0    MPH  2.31  3.07    METS  2.9  3.72    RPE  9  13    Perceived Dyspnea   0  -    VO2 Peak  10.2  13.02    Symptoms  No  No    Resting HR  81 bpm  92 bpm    Resting BP  118/62  132/64    Resting Oxygen Saturation   96 %  -    Exercise Oxygen  Saturation  during 6 min walk  97 %  -    Max Ex. HR  116 bpm  117 bpm    Max Ex. BP  150/64  164/82    2 Minute Post BP  130/64  -        Dr. Emily Filbert is Medical Director for [facility].

## 2018-02-14 DIAGNOSIS — I214 Non-ST elevation (NSTEMI) myocardial infarction: Secondary | ICD-10-CM

## 2018-02-14 NOTE — Progress Notes (Signed)
Daily Session Note  Patient Details  Name: Peter Morales MRN: [id number] Date of Birth: [birth year] Referring Provider:     Cardiac Rehab from 12/05/2017 in [facility]  Referring Provider  Tobe Sos      Encounter Date: 02/14/2018  Check In: Session Check In - 02/14/18 0851      Check-In   Location  [facility]    Staff Present  Alberteen Sam, MA, RCEP, CCRP, Exercise Physiologist;Ahmya Bernick Oletta Darter, [place], ACSM CEP, Exercise Physiologist;Meredith Sherryll Burger, RN BSN    Supervising physician immediately available to respond to emergencies  See telemetry face sheet for immediately available ER MD    Medication changes reported      No    Fall or balance concerns reported     No    Warm-up and Cool-down  Performed on first and last piece of equipment    Resistance Training Performed  Yes    VAD Patient?  No      Pain Assessment   Currently in Pain?  No/denies    Multiple Pain Sites  No          Social History   Tobacco Use  Smoking Status Former Smoker  . Last attempt to quit: 07/23/1983  . Years since quitting: 34.5  Smokeless Tobacco Never Used    Goals Met:  Independence with exercise equipment Exercise tolerated well No report of cardiac concerns or symptoms Strength training completed today  Goals Unmet:  Not Applicable  Comments: Pt able to follow exercise prescription today without complaint.  Will continue to monitor for progression.    Dr. Emily Filbert is Medical Director for [facility].

## 2018-02-17 ENCOUNTER — Encounter: Payer: Medicare Other | Admitting: *Deleted

## 2018-02-17 DIAGNOSIS — I214 Non-ST elevation (NSTEMI) myocardial infarction: Secondary | ICD-10-CM

## 2018-02-17 NOTE — Progress Notes (Signed)
Daily Session Note  Patient Details  Name: Coyt Govoni MRN: [id number] Date of Birth: [birth year] Referring Provider:     Cardiac Rehab from 12/05/2017 in [facility]  Referring Provider  Tobe Sos      Encounter Date: 02/17/2018  Check In: Session Check In - 02/17/18 0845      Check-In   Location  [facility]    Staff Present  Earlean Shawl, BS, ACSM CEP, Exercise Physiologist;Jessica Luan Pulling, MA, RCEP, CCRP, Exercise Physiologist;Susanne Bice, RN, BSN, CCRP    Supervising physician immediately available to respond to emergencies  See telemetry face sheet for immediately available ER MD    Medication changes reported      No    Fall or balance concerns reported     No    Warm-up and Cool-down  Performed on first and last piece of equipment    Resistance Training Performed  Yes    VAD Patient?  No      Pain Assessment   Currently in Pain?  No/denies    Multiple Pain Sites  No          Social History   Tobacco Use  Smoking Status Former Smoker  . Last attempt to quit: 07/23/1983  . Years since quitting: 34.5  Smokeless Tobacco Never Used    Goals Met:  Independence with exercise equipment Exercise tolerated well No report of cardiac concerns or symptoms Strength training completed today  Goals Unmet:  Not Applicable  Comments: Pt able to follow exercise prescription today without complaint.  Will continue to monitor for progression.    Dr. Emily Filbert is Medical Director for [facility].

## 2018-02-19 ENCOUNTER — Encounter: Payer: Self-pay | Admitting: *Deleted

## 2018-02-19 DIAGNOSIS — I214 Non-ST elevation (NSTEMI) myocardial infarction: Secondary | ICD-10-CM

## 2018-02-19 NOTE — Patient Instructions (Addendum)
Discharge Patient Instructions  Patient Details  Name: Peter Morales MRN: [id number] Date of Birth: 1950/11/27 Referring Provider:  Leonia Reeves,*   Number of Visits: 36  Reason for Discharge:  Patient reached a stable level of exercise. Patient independent in their exercise. Patient has met program and personal goals.  Smoking History:  Social History   Tobacco Use  Smoking Status Former Smoker  . Last attempt to quit: 07/23/1983  . Years since quitting: 34.6  Smokeless Tobacco Never Used    Diagnosis:  NSTEMI (non-ST elevated myocardial infarction) [facility])  Initial Exercise Prescription: Initial Exercise Prescription - 12/05/17 1400      Date of Initial Exercise RX and Referring Provider   Date  12/05/17    Referring Provider  Tobe Sos      Treadmill   MPH  2.3    Grade  0.5    Minutes  15    METs  2.9      REL-XR   Level  3    Speed  50    Minutes  15    METs  2.9      T5 Nustep   Level  2    SPM  80    Minutes  15    METs  2.9      Prescription Details   Frequency (times per week)  3    Duration  Progress to 45 minutes of aerobic exercise without signs/symptoms of physical distress      Intensity   THRR 40-80% of Max Heartrate  109-137    Ratings of Perceived Exertion  11-13    Perceived Dyspnea  0-4      Resistance Training   Training Prescription  Yes    Weight  3 lb    Reps  10-15       Discharge Exercise Prescription (Final Exercise Prescription Changes): Exercise Prescription Changes - 02/13/18 0800      Response to Exercise   Blood Pressure (Admit)  132/64    Blood Pressure (Exercise)  164/82    Blood Pressure (Exit)  100/62    Heart Rate (Admit)  89 bpm    Heart Rate (Exercise)  125 bpm    Heart Rate (Exit)  88 bpm    Rating of Perceived Exertion (Exercise)  14    Symptoms  none    Duration  Continue with 45 min of aerobic exercise without signs/symptoms of physical distress.    Intensity  THRR unchanged       Progression   Progression  Continue to progress workloads to maintain intensity without signs/symptoms of physical distress.    Average METs  3.34      Resistance Training   Training Prescription  Yes    Weight  5 lbs    Reps  10-15      Interval Training   Interval Training  No      Treadmill   MPH  2.5    Grade  1.5    Minutes  15    METs  3.43      REL-XR   Level  7    Minutes  15    METs  4      T5 Nustep   Level  4    Minutes  15    METs  2.6      Home Exercise Plan   Plans to continue exercise at  Home (comment) walking and stairs    Frequency  Add 3 additional days  to program exercise sessions.    Initial Home Exercises Provided  12/18/17       Functional Capacity: 6 Minute Walk    [place] Name 12/05/17 1436 02/12/18 0852       6 Minute Walk   Phase  -  Discharge    Distance  1220 feet  1620 feet    Distance % Change  -  32.8 %    Distance Feet Change  -  400 ft    Walk Time  6 minutes  6 minutes    # of Rest Breaks  0  0    MPH  2.31  3.07    METS  2.9  3.72    RPE  9  13    Perceived Dyspnea   0  -    VO2 Peak  10.2  13.02    Symptoms  No  No    Resting HR  81 bpm  92 bpm    Resting BP  118/62  132/64    Resting Oxygen Saturation   96 %  -    Exercise Oxygen Saturation  during 6 min walk  97 %  -    Max Ex. HR  116 bpm  117 bpm    Max Ex. BP  150/64  164/82    2 Minute Post BP  130/64  -       Quality of Life: Quality of Life - 02/12/18 0854      Quality of Life Scores   Health/Function Pre  18.77 %    Health/Function Post  21.17 %    Health/Function % Change  12.79 %    Socioeconomic Pre  23.83 %    Socioeconomic Post  22.21 %    Socioeconomic % Change   -6.8 %    Psych/Spiritual Pre  24.93 %    Psych/Spiritual Post  23.93 %    Psych/Spiritual % Change  -4.01 %    Family Pre  27.6 %    Family Post  27.6 %    Family % Change  0 %    GLOBAL Pre  22.33 %    GLOBAL Post  22.9 %    GLOBAL % Change  2.55 %       Personal  Goals: Goals established at orientation with interventions provided to work toward goal. Personal Goals and Risk Factors at Admission - 12/05/17 1433      Core Components/Risk Factors/Patient Goals on Admission    Weight Management  Yes;Obesity;Weight Loss    Intervention  Weight Management: Develop a combined nutrition and exercise program designed to reach desired caloric intake, while maintaining appropriate intake of nutrient and fiber, sodium and fats, and appropriate energy expenditure required for the weight goal.;Weight Management: Provide education and appropriate resources to help participant work on and attain dietary goals.;Weight Management/Obesity: Establish reasonable short term and long term weight goals.;Obesity: Provide education and appropriate resources to help participant work on and attain dietary goals.    Admit Weight  198 lb 8 oz (90 kg)    Goal Weight: Short Term  194 lb (88 kg)    Goal Weight: Long Term  170 lb (77.1 kg)    Expected Outcomes  Short Term: Continue to assess and modify interventions until short term weight is achieved;Long Term: Adherence to nutrition and physical activity/exercise program aimed toward attainment of established weight goal;Weight Loss: Understanding of general recommendations for a balanced deficit meal plan, which promotes 1-2 lb weight loss  per week and includes a negative energy balance of [phone] kcal/d;Understanding recommendations for meals to include 15-35% energy as protein, 25-35% energy from fat, 35-60% energy from carbohydrates, less than 255m of dietary cholesterol, 20-35 gm of total fiber daily;Understanding of distribution of calorie intake throughout the day with the consumption of 4-5 meals/snacks    Diabetes  Yes    Intervention  Provide education about signs/symptoms and action to take for hypo/hyperglycemia.;Provide education about proper nutrition, including hydration, and aerobic/resistive exercise prescription along with  prescribed medications to achieve blood glucose in normal ranges: Fasting glucose 65-99 mg/dL    Expected Outcomes  Short Term: Participant verbalizes understanding of the signs/symptoms and immediate care of hyper/hypoglycemia, proper foot care and importance of medication, aerobic/resistive exercise and nutrition plan for blood glucose control.;Long Term: Attainment of HbA1C < 7%.    Hypertension  Yes    Intervention  Provide education on lifestyle modifcations including regular physical activity/exercise, weight management, moderate sodium restriction and increased consumption of fresh fruit, vegetables, and low fat dairy, alcohol moderation, and smoking cessation.;Monitor prescription use compliance.    Expected Outcomes  Short Term: Continued assessment and intervention until BP is < 140/910mHG in hypertensive participants. < 130/8060mG in hypertensive participants with diabetes, heart failure or chronic kidney disease.;Long Term: Maintenance of blood pressure at goal levels.    Lipids  Yes    Intervention  Provide education and support for participant on nutrition & aerobic/resistive exercise along with prescribed medications to achieve LDL <31m18mDL >40mg25m Expected Outcomes  Short Term: Participant states understanding of desired cholesterol values and is compliant with medications prescribed. Participant is following exercise prescription and nutrition guidelines.;Long Term: Cholesterol controlled with medications as prescribed, with individualized exercise RX and with personalized nutrition plan. Value goals: LDL < 31mg,31m > 40 mg.    Stress  Yes Chevon's job is very stressful. His wife's health is also declining some. His doctor told him 60% of his heart issues are caused by genetics which is discouraging to him.     Intervention  Offer individual and/or small group education and counseling on adjustment to heart disease, stress management and health-related lifestyle change. Teach and  support self-help strategies.;Refer participants experiencing significant psychosocial distress to appropriate mental health specialists for further evaluation and treatment. When possible, include family members and significant others in education/counseling sessions.    Expected Outcomes  Short Term: Participant demonstrates changes in health-related behavior, relaxation and other stress management skills, ability to obtain effective social support, and compliance with psychotropic medications if prescribed.;Long Term: Emotional wellbeing is indicated by absence of clinically significant psychosocial distress or social isolation.        Personal Goals Discharge: Goals and Risk Factor Review - 01/31/18 0752      Core Components/Risk Factors/Patient Goals Review   Personal Goals Review  Weight Management/Obesity;Diabetes;Hypertension;Lipids    Review  PierreAhaneen doing well in rehab.  His weight continues to yoyo, but he would like to get down to 194lbs.  We talked about adding in more home exercise, but he is also concerned about the stress at work. His blood pressures have been good.  He has a machine at home to check it but doesn't trust it completely.  We talked about bringing it in to check.  He has not been checking his blood sugars but has not had any feelings of being too low .  He has not had any problems with his medications.  Expected Outcomes  Short: Bring in cuff to check with [place].  Long: Continue to work on risk factor modifications.        Exercise Goals and Review: Exercise Goals    Row Name 12/05/17 1435             Exercise Goals   Increase Physical Activity  Yes       Intervention  Provide advice, education, support and counseling about physical activity/exercise needs.;Develop an individualized exercise prescription for aerobic and resistive training based on initial evaluation findings, risk stratification, comorbidities and participant's personal goals.        Expected Outcomes  Achievement of increased cardiorespiratory fitness and enhanced flexibility, muscular endurance and strength shown through measurements of functional capacity and personal statement of participant.       Increase Strength and Stamina  Yes       Intervention  Provide advice, education, support and counseling about physical activity/exercise needs.;Develop an individualized exercise prescription for aerobic and resistive training based on initial evaluation findings, risk stratification, comorbidities and participant's personal goals.       Expected Outcomes  Achievement of increased cardiorespiratory fitness and enhanced flexibility, muscular endurance and strength shown through measurements of functional capacity and personal statement of participant.       Able to understand and use rate of perceived exertion (RPE) scale  Yes       Intervention  Provide education and explanation on how to use RPE scale       Expected Outcomes  Short Term: Able to use RPE daily in rehab to express subjective intensity level;Long Term:  Able to use RPE to guide intensity level when exercising independently       Able to understand and use Dyspnea scale  Yes       Intervention  Provide education and explanation on how to use Dyspnea scale       Expected Outcomes  Short Term: Able to use Dyspnea scale daily in rehab to express subjective sense of shortness of breath during exertion;Long Term: Able to use Dyspnea scale to guide intensity level when exercising independently       Knowledge and understanding of Target Heart Rate Range (THRR)  Yes       Intervention  Provide education and explanation of THRR including how the numbers were predicted and where they are located for reference       Expected Outcomes  Long Term: Able to use THRR to govern intensity when exercising independently;Short Term: Able to state/look up THRR;Short Term: Able to use daily as guideline for intensity in rehab       Able to  check pulse independently  Yes       Intervention  Provide education and demonstration on how to check pulse in carotid and radial arteries.;Review the importance of being able to check your own pulse for safety during independent exercise       Expected Outcomes  Short Term: Able to explain why pulse checking is important during independent exercise;Long Term: Able to check pulse independently and accurately       Understanding of Exercise Prescription  Yes       Intervention  Provide education, explanation, and written materials on patient's individual exercise prescription       Expected Outcomes  Short Term: Able to explain program exercise prescription;Long Term: Able to explain home exercise prescription to exercise independently          Nutrition & Weight - Outcomes: Pre  Biometrics - 12/05/17 1434      Pre Biometrics   Height  5' 6"  (1.676 m)    Weight  198 lb 8 oz (90 kg)    Waist Circumference  42 inches    Hip Circumference  42 inches    Waist to Hip Ratio  1 %    BMI (Calculated)  32.05    Single Leg Stand  20.66 seconds      Post Biometrics - 02/12/18 0853       Post  Biometrics   Height  5' 6"  (1.676 m)    Weight  201 lb (91.2 kg)    Waist Circumference  41 inches    Hip Circumference  40 inches    Waist to Hip Ratio  1.02 %    BMI (Calculated)  32.46    Single Leg Stand  30 seconds       Nutrition: Nutrition Therapy & Goals - 01/08/18 0858      Nutrition Therapy   Diet  TLC    Drug/Food Interactions  Statins/Certain Fruits    Protein (specify units)  140g    Fiber  35 grams    Saturated Fats  15 max. grams    Fruits and Vegetables  5 servings/day    Sodium  1500 grams      Personal Nutrition Goals   Nutrition Goal  Swap your snacks. Try fruit, 1-2 portions of nuts, hummus and veggies, etc. in place of candy    Personal Goal #2  Increase fiber intake to help you feel more full and satisifed. Choose larger portions of vegetables and proteins than starches     Personal Goal #3  Control portions based on the portion sizes discussed today    Comments  has DM type II but does not follow a diabetic diet, has tried to eat less sweets and baked goods however      Intervention Plan   Intervention  Prescribe, educate and counsel regarding individualized specific dietary modifications aiming towards targeted core components such as weight, hypertension, lipid management, diabetes, heart failure and other comorbidities.    Expected Outcomes  Short Term Goal: Understand basic principles of dietary content, such as calories, fat, sodium, cholesterol and nutrients.;Short Term Goal: A plan has been developed with personal nutrition goals set during dietitian appointment.;Long Term Goal: Adherence to prescribed nutrition plan.       Nutrition Discharge: Nutrition Assessments - 02/12/18 0854      MEDFICTS Scores   Pre Score  24    Post Score  18    Score Difference  -6       Education Questionnaire Score: Knowledge Questionnaire Score - 02/12/18 0853      Knowledge Questionnaire Score   Pre Score  23/28    Post Score  21/28       Goals reviewed with patient; copy given to patient.

## 2018-02-19 NOTE — Progress Notes (Signed)
Cardiac Individual Treatment Plan  Patient Details  Name: Peter Morales MRN: [id number] Date of Birth: 23-Aug-1951 Referring Provider:     Cardiac Rehab from 12/05/2017 in [facility]  Referring Provider  Tobe Sos      Initial Encounter Date:    Cardiac Rehab from 12/05/2017 in [facility] Cardiac and Pulmonary Rehab  Date  12/05/17  Referring Provider  Tobe Sos      Visit Diagnosis: NSTEMI (non-ST elevated myocardial infarction) [facility])  Patient's Home Medications on Admission:  Current Outpatient Medications:  .  albuterol (PROVENTIL HFA;VENTOLIN HFA) 108 (90 Base) MCG/ACT inhaler, Inhale 2 puffs into the lungs every 6 (six) hours as needed for wheezing or shortness of breath., Disp: , Rfl:  .  aspirin EC 81 MG tablet, Take 81 mg by mouth daily., Disp: , Rfl:  .  atorvastatin (LIPITOR) 80 MG tablet, Take 80 mg by mouth daily., Disp: , Rfl:  .  azelastine (ASTELIN) 0.1 % nasal spray, Place 1 spray into both nostrils 2 (two) times daily. Use in each nostril as directed, Disp: , Rfl:  .  azelastine (OPTIVAR) 0.05 % ophthalmic solution, Place into both eyes 2 (two) times daily., Disp: , Rfl:  .  carvedilol (COREG) 12.5 MG tablet, Take 12.5 mg by mouth 2 (two) times daily with a meal., Disp: , Rfl:  .  cetirizine (ZYRTEC) 10 MG tablet, Take 10 mg by mouth daily., Disp: , Rfl:  .  clopidogrel (PLAVIX) 75 MG tablet, Take 75 mg by mouth daily., Disp: , Rfl:  .  cyclobenzaprine (FLEXERIL) 5 MG tablet, Take 5 mg by mouth 3 (three) times daily as needed for muscle spasms., Disp: , Rfl:  .  Diphenhydramine-APAP 25-500 MG TABS, Take 500 mg by mouth every evening., Disp: , Rfl:  .  fenofibrate (TRICOR) 145 MG tablet, Take 145 mg by mouth daily., Disp: , Rfl:  .  fexofenadine (ALLEGRA) 180 MG tablet, Take by mouth., Disp: , Rfl:  .  fluticasone (FLONASE) 50 MCG/ACT nasal spray, Place into the nose., Disp: , Rfl:  .  glipiZIDE (GLUCOTROL) 10 MG tablet, Take 10 mg by mouth daily before  breakfast., Disp: , Rfl:  .  isosorbide mononitrate (IMDUR) 30 MG 24 hr tablet, Take by mouth., Disp: , Rfl:  .  ketotifen (ZADITOR) 0.025 % ophthalmic solution, Apply to eye., Disp: , Rfl:  .  lisinopril (PRINIVIL,ZESTRIL) 10 MG tablet, Take 10 mg by mouth daily., Disp: , Rfl:  .  LORazepam (ATIVAN) 0.5 MG tablet, Take 0.5 mg by mouth every 8 (eight) hours. takr 1/2 tab as needed, Disp: , Rfl:  .  metFORMIN (GLUMETZA) 1000 MG (MOD) 24 hr tablet, Take 1,000 mg by mouth 2 (two) times daily with a meal., Disp: , Rfl:  .  montelukast (SINGULAIR) 10 MG tablet, Take by mouth., Disp: , Rfl:  .  nitroGLYCERIN (NITROSTAT) 0.4 MG SL tablet, Place 0.4 mg under the tongue every 5 (five) minutes as needed for chest pain., Disp: , Rfl:  .  pantoprazole (PROTONIX) 40 MG tablet, Take 40 mg by mouth daily., Disp: , Rfl:  .  Propylhexedrine (BENZEDREX) INHA, Place into the nose., Disp: , Rfl:  .  rosuvastatin (CRESTOR) 40 MG tablet, Take by mouth., Disp: , Rfl:  .  ticagrelor (BRILINTA) 90 MG TABS tablet, Take by mouth., Disp: , Rfl:   Past Medical History: Past Medical History:  Diagnosis Date  . Anxiety   . Coronary artery disease   . Diabetes mellitus without complication ([place])   .  Erectile dysfunction   . GERD (gastroesophageal reflux disease)   . Glaucoma   . Hyperlipidemia   . Hypertension   . Insomnia   . Myocardial infarct (H[facility])     Tobacco Use: Social History   Tobacco Use  Smoking Status Former Smoker  . Last attempt to quit: 07/23/1983  . Years since quitting: 34.6  Smokeless Tobacco Never Used    Labs: Recent Review Flowsheet Data    There is no flowsheet data to display.       Exercise Target Goals:    Exercise Program Goal: Individual exercise prescription set using results from initial 6 min walk test and THRR while considering  patient's activity barriers and safety.   Exercise Prescription Goal: Initial exercise prescription builds to 30-45 minutes a day of aerobic  activity, 2-3 days per week.  Home exercise guidelines will be given to patient during program as part of exercise prescription that the participant will acknowledge.  Activity Barriers & Risk Stratification: Activity Barriers & Cardiac Risk Stratification - 12/05/17 1440      Activity Barriers & Cardiac Risk Stratification   Activity Barriers  Arthritis;Back Problems;Shortness of Breath;Chest Pain/Angina;Joint Problems    Cardiac Risk Stratification  High       6 Minute Walk: 6 Minute Walk    Row Name 12/05/17 1436 02/12/18 0852       6 Minute Walk   Phase  -  Discharge    Distance  1220 feet  1620 feet    Distance % Change  -  32.8 %    Distance Feet Change  -  400 ft    Walk Time  6 minutes  6 minutes    # of Rest Breaks  0  0    MPH  2.31  3.07    METS  2.9  3.72    RPE  9  13    Perceived Dyspnea   0  -    VO2 Peak  10.2  13.02    Symptoms  No  No    Resting HR  81 bpm  92 bpm    Resting BP  118/62  132/64    Resting Oxygen Saturation   96 %  -    Exercise Oxygen Saturation  during 6 min walk  97 %  -    Max Ex. HR  116 bpm  117 bpm    Max Ex. BP  150/64  164/82    2 Minute Post BP  130/64  -       Oxygen Initial Assessment:   Oxygen Re-Evaluation:   Oxygen Discharge (Final Oxygen Re-Evaluation):   Initial Exercise Prescription: Initial Exercise Prescription - 12/05/17 1400      Date of Initial Exercise RX and Referring Provider   Date  12/05/17    Referring Provider  Tobe Sos      Treadmill   MPH  2.3    Grade  0.5    Minutes  15    METs  2.9      REL-XR   Level  3    Speed  50    Minutes  15    METs  2.9      T5 Nustep   Level  2    SPM  80    Minutes  15    METs  2.9      Prescription Details   Frequency (times per week)  3    Duration  Progress to 45 minutes  of aerobic exercise without signs/symptoms of physical distress      Intensity   THRR 40-80% of Max Heartrate  109-137    Ratings of Perceived Exertion  11-13    Perceived  Dyspnea  0-4      Resistance Training   Training Prescription  Yes    Weight  3 lb    Reps  10-15       Perform Capillary Blood Glucose checks as needed.  Exercise Prescription Changes: Exercise Prescription Changes    Row Name 12/05/17 1400 12/16/17 1500 12/18/17 0800 01/01/18 1400 01/14/18 1000     Response to Exercise   Blood Pressure (Admit)  118/62  128/72  -  124/72  150/80   Blood Pressure (Exercise)  150/64  124/56  -  144/88  146/84   Blood Pressure (Exit)  130/64  120/70  -  142/80  126/60   Heart Rate (Admit)  80 bpm  85 bpm  -  88 bpm  97 bpm   Heart Rate (Exercise)  119 bpm  116 bpm  -  119 bpm  118 bpm   Heart Rate (Exit)  81 bpm  97 bpm  -  86 bpm  97 bpm   Oxygen Saturation (Admit)  96 %  -  -  -  -   Oxygen Saturation (Exit)  97 %  -  -  -  -   Rating of Perceived Exertion (Exercise)  9  12  -  14  14   Symptoms  -  none  -  none  none   Comments  -  second full day of exericse  -  -  -   Duration  -  Continue with 45 min of aerobic exercise without signs/symptoms of physical distress.  -  Continue with 45 min of aerobic exercise without signs/symptoms of physical distress.  Continue with 45 min of aerobic exercise without signs/symptoms of physical distress.   Intensity  -  THRR unchanged  -  THRR unchanged  THRR unchanged     Progression   Progression  -  Continue to progress workloads to maintain intensity without signs/symptoms of physical distress.  -  Continue to progress workloads to maintain intensity without signs/symptoms of physical distress.  Continue to progress workloads to maintain intensity without signs/symptoms of physical distress.   Average METs  -  2.51  -  3.19  3.19     Resistance Training   Training Prescription  -  Yes  -  Yes  Yes   Weight  -  3 lb  -  3 lb  4 lbs   Reps  -  10-15  -  10-15  10-15     Interval Training   Interval Training  -  No  -  No  No     Treadmill   MPH  -  2.3  -  2.3  2.5   Grade  -  0.5  -  1  1.5    Minutes  -  15  -  15  15   METs  -  2.92  -  3.17  3.43     REL-XR   Level  -  3  -  3  7   Minutes  -  15  -  15  15   METs  -  2.4  -  3.7  3.8     T5 Nustep   Level  -  2  -  2  3   Minutes  -  15  -  15  15   METs  -  2.2  -  3.4  2.5     Home Exercise Plan   Plans to continue exercise at  -  -  Home (comment) walking and stairs  Home (comment) walking and stairs  Home (comment) walking and stairs   Frequency  -  -  Add 3 additional days to program exercise sessions.  Add 3 additional days to program exercise sessions.  Add 3 additional days to program exercise sessions.   Initial Home Exercises Provided  -  -  12/18/17  12/18/17  12/18/17   Row Name 01/29/18 1500 02/13/18 0800           Response to Exercise   Blood Pressure (Admit)  128/62  132/64      Blood Pressure (Exercise)  148/72  164/82      Blood Pressure (Exit)  138/74  100/62      Heart Rate (Admit)  99 bpm  89 bpm      Heart Rate (Exercise)  124 bpm  125 bpm      Heart Rate (Exit)  88 bpm  88 bpm      Rating of Perceived Exertion (Exercise)  14  14      Symptoms  none  none      Duration  Continue with 45 min of aerobic exercise without signs/symptoms of physical distress.  Continue with 45 min of aerobic exercise without signs/symptoms of physical distress.      Intensity  THRR unchanged  THRR unchanged        Progression   Progression  Continue to progress workloads to maintain intensity without signs/symptoms of physical distress.  Continue to progress workloads to maintain intensity without signs/symptoms of physical distress.      Average METs  3.41  3.34        Resistance Training   Training Prescription  Yes  Yes      Weight  4 lbs  5 lbs      Reps  10-15  10-15        Interval Training   Interval Training  No  No        Treadmill   MPH  2.5  2.5      Grade  1.5  1.5      Minutes  15  15      METs  3.43  3.43        REL-XR   Level  7  7      Minutes  15  15      METs  4.5  4        T5  Nustep   Level  3  4      Minutes  15  15      METs  2.3  2.6        Home Exercise Plan   Plans to continue exercise at  Home (comment) walking and stairs  Home (comment) walking and stairs      Frequency  Add 3 additional days to program exercise sessions.  Add 3 additional days to program exercise sessions.      Initial Home Exercises Provided  12/18/17  12/18/17         Exercise Comments: Exercise Comments    Row Name 12/11/17 0736           Exercise Comments  First full  day of exercise!  Patient was oriented to gym and equipment including functions, settings, policies, and procedures.  Patient's individual exercise prescription and treatment plan were reviewed.  All starting workloads were established based on the results of the 6 minute walk test done at initial orientation visit.  The plan for exercise progression was also introduced and progression will be customized based on patient's performance and goals.          Exercise Goals and Review: Exercise Goals    Row Name 12/05/17 1435             Exercise Goals   Increase Physical Activity  Yes       Intervention  Provide advice, education, support and counseling about physical activity/exercise needs.;Develop an individualized exercise prescription for aerobic and resistive training based on initial evaluation findings, risk stratification, comorbidities and participant's personal goals.       Expected Outcomes  Achievement of increased cardiorespiratory fitness and enhanced flexibility, muscular endurance and strength shown through measurements of functional capacity and personal statement of participant.       Increase Strength and Stamina  Yes       Intervention  Provide advice, education, support and counseling about physical activity/exercise needs.;Develop an individualized exercise prescription for aerobic and resistive training based on initial evaluation findings, risk stratification, comorbidities and  participant's personal goals.       Expected Outcomes  Achievement of increased cardiorespiratory fitness and enhanced flexibility, muscular endurance and strength shown through measurements of functional capacity and personal statement of participant.       Able to understand and use rate of perceived exertion (RPE) scale  Yes       Intervention  Provide education and explanation on how to use RPE scale       Expected Outcomes  Short Term: Able to use RPE daily in rehab to express subjective intensity level;Long Term:  Able to use RPE to guide intensity level when exercising independently       Able to understand and use Dyspnea scale  Yes       Intervention  Provide education and explanation on how to use Dyspnea scale       Expected Outcomes  Short Term: Able to use Dyspnea scale daily in rehab to express subjective sense of shortness of breath during exertion;Long Term: Able to use Dyspnea scale to guide intensity level when exercising independently       Knowledge and understanding of Target Heart Rate Range (THRR)  Yes       Intervention  Provide education and explanation of THRR including how the numbers were predicted and where they are located for reference       Expected Outcomes  Long Term: Able to use THRR to govern intensity when exercising independently;Short Term: Able to state/look up THRR;Short Term: Able to use daily as guideline for intensity in rehab       Able to check pulse independently  Yes       Intervention  Provide education and demonstration on how to check pulse in carotid and radial arteries.;Review the importance of being able to check your own pulse for safety during independent exercise       Expected Outcomes  Short Term: Able to explain why pulse checking is important during independent exercise;Long Term: Able to check pulse independently and accurately       Understanding of Exercise Prescription  Yes       Intervention  Provide education, explanation, and  written  materials on patient's individual exercise prescription       Expected Outcomes  Short Term: Able to explain program exercise prescription;Long Term: Able to explain home exercise prescription to exercise independently          Exercise Goals Re-Evaluation : Exercise Goals Re-Evaluation    Row Name 12/11/17 0738 12/16/17 1557 12/18/17 0847 01/01/18 1455 01/03/18 0813     Exercise Goal Re-Evaluation   Exercise Goals Review  Understanding of Exercise Prescription;Knowledge and understanding of Target Heart Rate Range (THRR);Able to understand and use rate of perceived exertion (RPE) scale  Increase Physical Activity;Increase Strength and Stamina;Understanding of Exercise Prescription  Increase Physical Activity;Increase Strength and Stamina;Understanding of Exercise Prescription;Knowledge and understanding of Target Heart Rate Range (THRR);Able to understand and use rate of perceived exertion (RPE) scale;Able to check pulse independently  Increase Physical Activity;Understanding of Exercise Prescription;Increase Strength and Stamina  Increase Physical Activity;Increase Strength and Stamina;Able to understand and use rate of perceived exertion (RPE) scale   Comments  Reviewed RPE scale, THR and program prescription with pt today.  Pt voiced understanding and was given a copy of goals to take home.   Arias is off to a good start in rehab.  He has completed two full days of exercise.  We will continue to monitor his progression.   Reviewed home exercise with pt today.  Pt plans to walk at home and stores for exercise.  Reviewed THR, pulse, RPE, sign and symptoms, NTG use, and when to call 911 or MD.  Also discussed weather considerations and indoor options.  Pt voiced understanding.  Ja has been doing well in rehab.  He has been able to return to rehab after being out sick.  He has come back to his normal workloads.  We will continue to monitor his progression.  Pt can tell he has less shortness of breath  and his knees feel better going up stairs.  He walks while he is on the phone.     Expected Outcomes  Short: Use RPE daily to regulate intensity.  Long: Follow program prescription in THR.  Short: Continue to attend rehab regularly.  Long: Continue to follow program exercise prescription.   Short: Add in at least one extra day of home exercise.  Long: Continue to exercise more.   Short: Begin to increase workloads now that he is feeling better.  Long: Continue to exercise to build strength and stamina.   Short - Pt will continue to attend class to strengthen legs He will add some leg extensions if sitting for a long period  Long - Pt will build strength in legs and reduce knee pain long term   Row Name 01/14/18 1036 01/29/18 1459 01/31/18 0749 02/13/18 0835       Exercise Goal Re-Evaluation   Exercise Goals Review  Increase Physical Activity;Increase Strength and Stamina;Able to understand and use rate of perceived exertion (RPE) scale  Increase Physical Activity;Increase Strength and Stamina;Understanding of Exercise Prescription  Increase Physical Activity;Increase Strength and Stamina;Understanding of Exercise Prescription  Increase Physical Activity;Increase Strength and Stamina;Understanding of Exercise Prescription    Comments  Sarah has been doing well in rehab.  He is now up to level 7 on the XR.  We will continue to monitor his progression.   Jarmal continues to do well in rehab.  He continues to be at an RPE of 14 on most pieces, but his METs are starting to improve more.  We will continue to monitor his progression.  Treylin is doing well in rehab.  He can tell that it is making a difference.  He is able to do more and not get as fatigued and he is now able to use the stairs easier.  He is ready to start doing more at home with wall push ups and squats.  He has been walking with the dog some, but with the spring coming he should be able to get out a little more frequently.    Tarun has been doing  well in rehab.  He is nearing graduation and improved post 6MWT by 449f!!!  He is planning to continue to walk after graduation.  He is also meeting with a pulmonologist today and plans to talk to him about the possiblity of joining pulmonary rehab after graduation.  We will continue to monitor his progress during class.     Expected Outcomes  Short: Continue to work increase workloads.  Long: Continue to work on leg strength  Short: Continue to increase workload on T5 NuStep.  Long: Continue to work on bAnimator   Short: Try to get out to walk and do more exercise at home.  Long: Continue to build strength and stamina.   Short: Talk about pulmonary rehab.  Long: Continue to exercise even after graduation.        Discharge Exercise Prescription (Final Exercise Prescription Changes): Exercise Prescription Changes - 02/13/18 0800      Response to Exercise   Blood Pressure (Admit)  132/64    Blood Pressure (Exercise)  164/82    Blood Pressure (Exit)  100/62    Heart Rate (Admit)  89 bpm    Heart Rate (Exercise)  125 bpm    Heart Rate (Exit)  88 bpm    Rating of Perceived Exertion (Exercise)  14    Symptoms  none    Duration  Continue with 45 min of aerobic exercise without signs/symptoms of physical distress.    Intensity  THRR unchanged      Progression   Progression  Continue to progress workloads to maintain intensity without signs/symptoms of physical distress.    Average METs  3.34      Resistance Training   Training Prescription  Yes    Weight  5 lbs    Reps  10-15      Interval Training   Interval Training  No      Treadmill   MPH  2.5    Grade  1.5    Minutes  15    METs  3.43      REL-XR   Level  7    Minutes  15    METs  4      T5 Nustep   Level  4    Minutes  15    METs  2.6      Home Exercise Plan   Plans to continue exercise at  Home (comment) walking and stairs    Frequency  Add 3 additional days to program exercise sessions.     Initial Home Exercises Provided  12/18/17       Nutrition:  Target Goals: Understanding of nutrition guidelines, daily intake of sodium <15074m cholesterol <20072mcalories 30% from fat and 7% or less from saturated fats, daily to have 5 or more servings of fruits and vegetables.  Biometrics: Pre Biometrics - 12/05/17 1434      Pre Biometrics   Height  5' 6"  (1.676 m)    Weight  198  lb 8 oz (90 kg)    Waist Circumference  42 inches    Hip Circumference  42 inches    Waist to Hip Ratio  1 %    BMI (Calculated)  32.05    Single Leg Stand  20.66 seconds      Post Biometrics - 02/12/18 0853       Post  Biometrics   Height  5' 6"  (1.676 m)    Weight  201 lb (91.2 kg)    Waist Circumference  41 inches    Hip Circumference  40 inches    Waist to Hip Ratio  1.02 %    BMI (Calculated)  32.46    Single Leg Stand  30 seconds       Nutrition Therapy Plan and Nutrition Goals: Nutrition Therapy & Goals - 01/08/18 0858      Nutrition Therapy   Diet  TLC    Drug/Food Interactions  Statins/Certain Fruits    Protein (specify units)  140g    Fiber  35 grams    Saturated Fats  15 max. grams    Fruits and Vegetables  5 servings/day    Sodium  1500 grams      Personal Nutrition Goals   Nutrition Goal  Swap your snacks. Try fruit, 1-2 portions of nuts, hummus and veggies, etc. in place of candy    Personal Goal #2  Increase fiber intake to help you feel more full and satisifed. Choose larger portions of vegetables and proteins than starches    Personal Goal #3  Control portions based on the portion sizes discussed today    Comments  has DM type II but does not follow a diabetic diet, has tried to eat less sweets and baked goods however      Intervention Plan   Intervention  Prescribe, educate and counsel regarding individualized specific dietary modifications aiming towards targeted core components such as weight, hypertension, lipid management, diabetes, heart failure and other  comorbidities.    Expected Outcomes  Short Term Goal: Understand basic principles of dietary content, such as calories, fat, sodium, cholesterol and nutrients.;Short Term Goal: A plan has been developed with personal nutrition goals set during dietitian appointment.;Long Term Goal: Adherence to prescribed nutrition plan.       Nutrition Assessments: Nutrition Assessments - 02/12/18 0854      MEDFICTS Scores   Pre Score  24    Post Score  18    Score Difference  -6       Nutrition Goals Re-Evaluation: Nutrition Goals Re-Evaluation    Row Name 01/31/18 0758             Goals   Nutrition Goal  Swap your snacks. Try fruit, 1-2 portions of nuts, hummus and veggies, etc. in place of candy, Increased fiber, more veggies and protein. Portion Control       Comment  Jai has swapped out some of his snacks but still eats sweets more than he should.  He has increased his fiber intake and eating more veggies and protein. He is still working on portion control.  He has been able to cut back but still has work to do.        Expected Outcome  Short: Continue to work on portion sizes and less candy.  Long: Continue to work on increasing healthier snacking.           Nutrition Goals Discharge (Final Nutrition Goals Re-Evaluation): Nutrition Goals Re-Evaluation - 01/31/18 1610  Goals   Nutrition Goal  Swap your snacks. Try fruit, 1-2 portions of nuts, hummus and veggies, etc. in place of candy, Increased fiber, more veggies and protein. Portion Control    Comment  Shawnte has swapped out some of his snacks but still eats sweets more than he should.  He has increased his fiber intake and eating more veggies and protein. He is still working on portion control.  He has been able to cut back but still has work to do.     Expected Outcome  Short: Continue to work on portion sizes and less candy.  Long: Continue to work on increasing healthier snacking.        Psychosocial: Target Goals:  Acknowledge presence or absence of significant depression and/or stress, maximize coping skills, provide positive support system. Participant is able to verbalize types and ability to use techniques and skills needed for reducing stress and depression.   Initial Review & Psychosocial Screening: Initial Psych Review & Screening - 12/05/17 1437      Initial Review   Current issues with  Current Stress Concerns    Source of Stress Concerns  Occupation    Comments  Bobby's job is very stressful. 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 He knows that this job is very stressful and said each of his heart attacks has happened after a very stressful event so he knows he needs to be careful. He also reported that his wife's health is declining some and he knows his health is not getting better      [place]?  Yes wife, daughters here, sons in [place] and [place]      Screening Interventions   Interventions  Yes;Encouraged to exercise    Expected Outcomes  Short Term goal: Utilizing psychosocial counselor, staff and physician to assist with identification of specific Stressors or current issues interfering with healing process. Setting desired goal for each stressor or current issue identified.;Long Term Goal: Stressors or current issues are controlled or eliminated.;Short Term goal: Identification and review with participant of any Quality of Life or Depression concerns found by scoring the questionnaire.;Long Term goal: The participant improves quality of Life and PHQ9 Scores as seen by post scores and/or verbalization of changes       Quality of Life Scores:  Quality of Life - 02/12/18 0854      Quality of Life Scores   Health/Function Pre  18.77 %    Health/Function Post  21.17 %    Health/Function % Change  12.79 %    Socioeconomic Pre  23.83 %    Socioeconomic Post  22.21 %    Socioeconomic % Change   -6.8 %     Psych/Spiritual Pre  24.93 %    Psych/Spiritual Post  23.93 %    Psych/Spiritual % Change  -4.01 %    Family Pre  27.6 %    Family Post  27.6 %    Family % Change  0 %    GLOBAL Pre  22.33 %    GLOBAL Post  22.9 %    GLOBAL % Change  2.55 %      Scores of 19 and below usually indicate a poorer quality of life in these areas.  A difference of  2-3 points is a clinically meaningful difference.  A difference of 2-3 points in the total score of the Quality of Life Index has  been associated with significant improvement in overall quality of life, self-image, physical symptoms, and general health in studies assessing change in quality of life.  PHQ-9: Recent Review Flowsheet Data    Depression screen [facility] 2/9 02/12/2018 12/05/2017   Decreased Interest 2 1   Down, Depressed, Hopeless 1 0   PHQ - 2 Score 3 1   Altered sleeping 3 3   Tired, decreased energy 2 2   Change in appetite 2 1   Feeling bad or failure about yourself  - 0   Trouble concentrating 0 0   Moving slowly or fidgety/restless 0 0   Suicidal thoughts 0 0   PHQ-9 Score 10 7   Difficult doing work/chores Not difficult at all Somewhat difficult     Interpretation of Total Score  Total Score Depression Severity:  1-4 = Minimal depression, 5-9 = Mild depression, 10-14 = Moderate depression, 15-19 = Moderately severe depression, 20-27 = Severe depression   Psychosocial Evaluation and Intervention: Psychosocial Evaluation - 02/17/18 1026      Discharge Psychosocial Assessment & Intervention   Comments  Counselor met with Altonio today for a discharge summary.  He reports making progress in sleeping a little better and having more energy since coming into this program.  He also has enjoyed the educational and psychoeducational information - especially on stress management and nutrition.  Rambo continues to have a positive support system with a spouse and two daughters who live locally.  Brelan's PHQ-9 scores increased from a "7" to  a "10" - indicating his depressive symptoms have increased since he came into this program.  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  His daughter is also going through a divorce currently and his spouse has some health issues as well as his job stress impacting all of this.  Jayin is in the process of developing a plan for his retirement.  counselor brainstormed this plan and discussed a graduated approach with some parameters to respecting his time in the evenings and on the weekends in the process.  Sarvesh has also scheduled a vacation in April for he and his wife to visit his son in [place] for 10 days.  He plans to walk to maintain his progress made in this program and to join a silver sneakers program upon his return from vacation.  Chanson has agreed to have a sleep study done if his sleep does not continue to improve as he graduates his retirement plan and has less stress.  Counselor commended Jessee on his progress made and his self-care plan for his health and less stress in the near future.         Psychosocial Re-Evaluation: Psychosocial Re-Evaluation    Row Name 01/03/18 0818 01/31/18 0755           Psychosocial Re-Evaluation   Current issues with  Current Stress Concerns  Current Stress Concerns      Comments  Pt states main stress is work.  He has started not taking laptop to bed and is working on not sleeping in [place].  He works long hours and with many countries in different time zones.  Shreyansh has been doing well in rehab.  His biggest stressor comes from work.  He put in to resign on 12/31 but they have not accepted it, nor are they looking for a  replacement.  They continue to work him hard and he is also nervous about retiring. He has about 3 weeks left in the program.  He is sleeping a little better as he frequently turns and tosses and  trips to bathroom.  He is trying not to sleep in the recliner as much and is going to bed each night.        Expected Outcomes  Short - not sleeping in recliner, use relaxed breathing to reduce stress Long - Continue to set time boundaries for work and use breathing and exercise to manage stress  Short: Continue to sleep better.  Long: Continue to work on stressors and coping at work.       Interventions  Encouraged to attend Cardiac Rehabilitation for the exercise  Encouraged to attend Cardiac Rehabilitation for the exercise;Stress management education      Continue Psychosocial Services   Follow up required by staff  Follow up required by staff        Initial Review   Source of Stress Concerns  -  Occupation         Psychosocial Discharge (Final Psychosocial Re-Evaluation): Psychosocial Re-Evaluation - 01/31/18 0755      Psychosocial Re-Evaluation   Current issues with  Current Stress Concerns    Comments  Karston has been doing well in rehab.  His biggest stressor comes from work.  He put in to resign on 12/31 but they have not accepted it, nor are they looking for a replacement.  They continue to work him hard and he is also nervous about retiring. He has about 3 weeks left in the program.  He is sleeping a little better as he frequently turns and tosses and trips to bathroom.  He is trying not to sleep in the recliner as much and is going to bed each night.      Expected Outcomes  Short: Continue to sleep better.  Long: Continue to work on stressors and coping at work.     Interventions  Encouraged to attend Cardiac Rehabilitation for the exercise;Stress management education    Continue Psychosocial Services   Follow up required by staff      Initial Review   Source of Stress Concerns  Occupation       Vocational Rehabilitation: Provide vocational rehab assistance to qualifying candidates.   Vocational Rehab Evaluation & Intervention: Vocational Rehab - 12/05/17 1439      Initial  Vocational Rehab Evaluation & Intervention   Assessment shows need for Vocational Rehabilitation  No       Education: Education Goals: Education classes will be provided on a variety of topics geared toward better understanding of heart health and risk factor modification. Participant will state understanding/return demonstration of topics presented as noted by education test scores.  Learning Barriers/Preferences: Learning Barriers/Preferences - 12/05/17 1439      Learning Barriers/Preferences   Learning Barriers  None    Learning Preferences  None       Education Topics:  AED/CPR: - Group verbal and written instruction with the use of models to demonstrate the basic use of the AED with the basic ABC's of resuscitation.   Cardiac Rehab from 02/19/2018 in [facility] Cardiac and Pulmonary Rehab  Date  01/06/18  Educator  SB  Instruction Review Code  1- Verbalizes Understanding      General Nutrition Guidelines/Fats and Fiber: -Group instruction provided by verbal, written material, models and posters to present the general guidelines for heart healthy nutrition. Gives an  explanation and review of dietary fats and fiber.   Cardiac Rehab from 02/19/2018 in [facility] Cardiac and Pulmonary Rehab  Date  02/17/18  Educator  CR  Instruction Review Code  1- Verbalizes Understanding      Controlling Sodium/Reading Food Labels: -Group verbal and written material supporting the discussion of sodium use in heart healthy nutrition. Review and explanation with models, verbal and written materials for utilization of the food label.   Cardiac Rehab from 02/19/2018 in [facility] Cardiac and Pulmonary Rehab  Date  12/30/17  Educator  PI  Instruction Review Code  1- Verbalizes Understanding      Exercise Physiology & General Exercise Guidelines: - Group verbal and written instruction with models to review the exercise physiology of the cardiovascular system and associated critical values. Provides general  exercise guidelines with specific guidelines to those with heart or lung disease.    Cardiac Rehab from 02/19/2018 in [facility] Cardiac and Pulmonary Rehab  Date  01/13/18  Educator  [facility]  Instruction Review Code  1- Verbalizes Understanding      Aerobic Exercise & Resistance Training: - Gives group verbal and written instruction on the various components of exercise. Focuses on aerobic and resistive training programs and the benefits of this training and how to safely progress through these programs..   Flexibility, Balance, Mind/Body Relaxation: Provides group verbal/written instruction on the benefits of flexibility and balance training, including mind/body exercise modes such as yoga, pilates and tai chi.  Demonstration and skill practice provided.   Cardiac Rehab from 02/19/2018 in [facility] Cardiac and Pulmonary Rehab  Date  01/22/18  Educator  [facility]  Instruction Review Code  1- Verbalizes Understanding      Stress and Anxiety: - Provides group verbal and written instruction about the health risks of elevated stress and causes of high stress.  Discuss the correlation between heart/lung disease and anxiety and treatment options. Review healthy ways to manage with stress and anxiety.   Cardiac Rehab from 02/19/2018 in [facility] Cardiac and Pulmonary Rehab  Date  01/29/18  Educator  [facility]  Instruction Review Code  1- Verbalizes Understanding      Depression: - Provides group verbal and written instruction on the correlation between heart/lung disease and depressed mood, treatment options, and the stigmas associated with seeking treatment.   Anatomy & Physiology of the Heart: - Group verbal and written instruction and models provide basic cardiac anatomy and physiology, with the coronary electrical and arterial systems. Review of Valvular disease and Heart Failure   Cardiac Procedures: - Group verbal and written instruction to review commonly prescribed medications for heart disease. Reviews the  medication, class of the drug, and side effects. Includes the steps to properly store meds and maintain the prescription regimen. (beta blockers and nitrates)   Cardiac Rehab from 02/19/2018 in [facility] Cardiac and Pulmonary Rehab  Date  02/19/18  Educator  SB  Instruction Review Code  1- Verbalizes Understanding      Cardiac Medications I: - Group verbal and written instruction to review commonly prescribed medications for heart disease. Reviews the medication, class of the drug, and side effects. Includes the steps to properly store meds and maintain the prescription regimen.   Cardiac Rehab from 02/19/2018 in [facility] Cardiac and Pulmonary Rehab  Date  02/03/18  Educator  SB  Instruction Review Code  1- Verbalizes Understanding      Cardiac Medications II: -Group verbal and written instruction to review commonly prescribed medications for heart disease. Reviews the medication, class of  the drug, and side effects. (all other drug classes)   Cardiac Rehab from 02/19/2018 in [facility] Cardiac and Pulmonary Rehab  Date  01/27/18  Educator  [facility]  Instruction Review Code  1- Verbalizes Understanding       Go Sex-Intimacy & Heart Disease, Get SMART - Goal Setting: - Group verbal and written instruction through game format to discuss heart disease and the return to sexual intimacy. Provides group verbal and written material to discuss and apply goal setting through the application of the S.M.A.R.T. Method.   Cardiac Rehab from 02/19/2018 in [facility] Cardiac and Pulmonary Rehab  Date  02/19/18  Educator  SB  Instruction Review Code  1- Verbalizes Understanding      Other Matters of the Heart: - Provides group verbal, written materials and models to describe Stable Angina and Peripheral Artery. Includes description of the disease process and treatment options available to the cardiac patient.   Exercise & Equipment Safety: - Individual verbal instruction and demonstration of equipment use and safety with  use of the equipment.   Cardiac Rehab from 02/19/2018 in [facility] Cardiac and Pulmonary Rehab  Date  12/05/17  Educator  [facility]  Instruction Review Code  1- Verbalizes Understanding      Infection Prevention: - Provides verbal and written material to individual with discussion of infection control including proper hand washing and proper equipment cleaning during exercise session.   Cardiac Rehab from 02/19/2018 in [facility] Cardiac and Pulmonary Rehab  Date  12/05/17  Educator  [facility]  Instruction Review Code  1- Verbalizes Understanding      Falls Prevention: - Provides verbal and written material to individual with discussion of falls prevention and safety.   Cardiac Rehab from 02/19/2018 in [facility] Cardiac and Pulmonary Rehab  Date  12/05/17  Educator  [facility]  Instruction Review Code  1- Verbalizes Understanding      Diabetes: - Individual verbal and written instruction to review signs/symptoms of diabetes, desired ranges of glucose level fasting, after meals and with exercise. Acknowledge that pre and post exercise glucose checks will be done for 3 sessions at entry of program.   Cardiac Rehab from 02/19/2018 in [facility] Cardiac and Pulmonary Rehab  Date  12/05/17  Educator  [facility]  Instruction Review Code  1- Verbalizes Understanding      Know Your Numbers and Risk Factors: -Group verbal and written instruction about important numbers in your health.  Discussion of what are risk factors and how they play a role in the disease process.  Review of Cholesterol, Blood Pressure, Diabetes, and BMI and the role they play in your overall health.   Cardiac Rehab from 02/19/2018 in [facility] Cardiac and Pulmonary Rehab  Date  01/27/18  Educator  [facility]  Instruction Review Code  1- Verbalizes Understanding      Sleep Hygiene: -Provides group verbal and written instruction about how sleep can affect your health.  Define sleep hygiene, discuss sleep cycles and impact of sleep habits. Review good sleep hygiene tips.     Cardiac Rehab from 02/19/2018 in [facility] Cardiac and Pulmonary Rehab  Date  02/12/18  Educator  [facility]  Instruction Review Code  1- Verbalizes Understanding      Other: -Provides group and verbal instruction on various topics (see comments)   Knowledge Questionnaire Score: Knowledge Questionnaire Score - 02/12/18 0853      Knowledge Questionnaire Score   Pre Score  23/28    Post Score  21/28       Core Components/Risk  Factors/Patient Goals at Admission: Personal Goals and Risk Factors at Admission - 12/05/17 1433      Core Components/Risk Factors/Patient Goals on Admission    Weight Management  Yes;Obesity;Weight Loss    Intervention  Weight Management: Develop a combined nutrition and exercise program designed to reach desired caloric intake, while maintaining appropriate intake of nutrient and fiber, sodium and fats, and appropriate energy expenditure required for the weight goal.;Weight Management: Provide education and appropriate resources to help participant work on and attain dietary goals.;Weight Management/Obesity: Establish reasonable short term and long term weight goals.;Obesity: Provide education and appropriate resources to help participant work on and attain dietary goals.    Admit Weight  198 lb 8 oz (90 kg)    Goal Weight: Short Term  194 lb (88 kg)    Goal Weight: Long Term  170 lb (77.1 kg)    Expected Outcomes  Short Term: Continue to assess and modify interventions until short term weight is achieved;Long Term: Adherence to nutrition and physical activity/exercise program aimed toward attainment of established weight goal;Weight Loss: Understanding of general recommendations for a balanced deficit meal plan, which promotes 1-2 lb weight loss per week and includes a negative energy balance of [phone] kcal/d;Understanding recommendations for meals to include 15-35% energy as protein, 25-35% energy from fat, 35-60% energy from carbohydrates, less than '200mg'$  of dietary  cholesterol, 20-35 gm of total fiber daily;Understanding of distribution of calorie intake throughout the day with the consumption of 4-5 meals/snacks    Diabetes  Yes    Intervention  Provide education about signs/symptoms and action to take for hypo/hyperglycemia.;Provide education about proper nutrition, including hydration, and aerobic/resistive exercise prescription along with prescribed medications to achieve blood glucose in normal ranges: Fasting glucose 65-99 mg/dL    Expected Outcomes  Short Term: Participant verbalizes understanding of the signs/symptoms and immediate care of hyper/hypoglycemia, proper foot care and importance of medication, aerobic/resistive exercise and nutrition plan for blood glucose control.;Long Term: Attainment of HbA1C < 7%.    Hypertension  Yes    Intervention  Provide education on lifestyle modifcations including regular physical activity/exercise, weight management, moderate sodium restriction and increased consumption of fresh fruit, vegetables, and low fat dairy, alcohol moderation, and smoking cessation.;Monitor prescription use compliance.    Expected Outcomes  Short Term: Continued assessment and intervention until BP is < 140/33m HG in hypertensive participants. < 130/839mHG in hypertensive participants with diabetes, heart failure or chronic kidney disease.;Long Term: Maintenance of blood pressure at goal levels.    Lipids  Yes    Intervention  Provide education and support for participant on nutrition & aerobic/resistive exercise along with prescribed medications to achieve LDL '70mg'$ , HDL >'40mg'$ .    Expected Outcomes  Short Term: Participant states understanding of desired cholesterol values and is compliant with medications prescribed. Participant is following exercise prescription and nutrition guidelines.;Long Term: Cholesterol controlled with medications as prescribed, with individualized exercise RX and with personalized nutrition plan. Value goals: LDL <  '70mg'$ , HDL > 40 mg.    Stress  Yes Danis's job is very stressful. His wife's health is also declining some. His doctor told him 60% of his heart issues are caused by genetics which is discouraging to him.     Intervention  Offer individual and/or small group education and counseling on adjustment to heart disease, stress management and health-related lifestyle change. Teach and support self-help strategies.;Refer participants experiencing significant psychosocial distress to appropriate mental health specialists for further evaluation and treatment. When possible, include  family members and significant others in education/counseling sessions.    Expected Outcomes  Short Term: Participant demonstrates changes in health-related behavior, relaxation and other stress management skills, ability to obtain effective social support, and compliance with psychotropic medications if prescribed.;Long Term: Emotional wellbeing is indicated by absence of clinically significant psychosocial distress or social isolation.       Core Components/Risk Factors/Patient Goals Review:  Goals and Risk Factor Review    Row Name 01/03/18 0809 01/31/18 0752           Core Components/Risk Factors/Patient Goals Review   Personal Goals Review  Weight Management/Obesity;Diabetes;Hypertension;Lipids  Weight Management/Obesity;Diabetes;Hypertension;Lipids      Review  Yeng reports his A1C stays steady and he doesnt experience high or low BG.  He is scheduled to meet with the RD 2/13.  He eats a Camera operator diet.  He can tell a differnec in going up stairs (much easier) and less SOB.    Keaten has been doing well in rehab.  His weight continues to yoyo, but he would like to get down to 194lbs.  We talked about adding in more home exercise, but he is also concerned about the stress at work. His blood pressures have been good.  He has a machine at home to check it but doesn't trust it completely.  We talked about bringing it in to  check.  He has not been checking his blood sugars but has not had any feelings of being too low .  He has not had any problems with his medications.       Expected Outcomes  Short - Pt will meet with RD and Make adjustments to improve his diet.  Long - Pt will control DM and A1C long term  Short: Bring in cuff to check with [place].  Long: Continue to work on risk factor modifications.          Core Components/Risk Factors/Patient Goals at Discharge (Final Review):  Goals and Risk Factor Review - 01/31/18 0752      Core Components/Risk Factors/Patient Goals Review   Personal Goals Review  Weight Management/Obesity;Diabetes;Hypertension;Lipids    Review  Benji has been doing well in rehab.  His weight continues to yoyo, but he would like to get down to 194lbs.  We talked about adding in more home exercise, but he is also concerned about the stress at work. His blood pressures have been good.  He has a machine at home to check it but doesn't trust it completely.  We talked about bringing it in to check.  He has not been checking his blood sugars but has not had any feelings of being too low .  He has not had any problems with his medications.     Expected Outcomes  Short: Bring in cuff to check with [place].  Long: Continue to work on risk factor modifications.        ITP Comments: ITP Comments    Row Name 12/05/17 1202 12/25/17 0601 01/22/18 0645 02/19/18 0610     ITP Comments  medical review completed. ITP sent to Dr Elta Guadel[place] Sabra Heck for review,changes as needed and signature   Documentation of diagnosis can be found in [facility] 11/05/2017 hospital admit  30 Day review. Continue with ITP unless directed changes per Medical Director review. New to program.    30 day review completed. Continue with ITP unless diercted changes per Medical Director.  30 Day review. Continue with ITP unless directed changes per Medical Director review.  Comments: Discharge ITP

## 2018-02-19 NOTE — Progress Notes (Signed)
Cardiac Individual Treatment Plan  Patient Details  Name: Peter Morales MRN: [id number] Date of Birth: 1951/11/15 Referring Provider:     Cardiac Rehab from 12/05/2017 in [facility] Cardiac [facility] Pulmonary Rehab  Referring Provider  Tobe Sos      Initial Encounter Date:    Cardiac Rehab from 12/05/2017 in [facility] Cardiac and Pulmonary Rehab  Date  12/05/17  Referring Provider  Tobe Sos      Visit Diagnosis: NSTEMI (non-ST elevated myocardial infarction) [facility])  Patient's Home Medications on Admission:  Current Outpatient Medications:  .  albuterol (PROVENTIL HFA;VENTOLIN HFA) 108 (90 Base) MCG/ACT inhaler, Inhale 2 puffs into the lungs every 6 (six) hours as needed for wheezing or shortness of breath., Disp: , Rfl:  .  aspirin EC 81 MG tablet, Take 81 mg by mouth daily., Disp: , Rfl:  .  atorvastatin (LIPITOR) 80 MG tablet, Take 80 mg by mouth daily., Disp: , Rfl:  .  azelastine (ASTELIN) 0.1 % nasal spray, Place 1 spray into both nostrils 2 (two) times daily. Use in each nostril as directed, Disp: , Rfl:  .  azelastine (OPTIVAR) 0.05 % ophthalmic solution, Place into both eyes 2 (two) times daily., Disp: , Rfl:  .  carvedilol (COREG) 12.5 MG tablet, Take 12.5 mg by mouth 2 (two) times daily with a meal., Disp: , Rfl:  .  cetirizine (ZYRTEC) 10 MG tablet, Take 10 mg by mouth daily., Disp: , Rfl:  .  clopidogrel (PLAVIX) 75 MG tablet, Take 75 mg by mouth daily., Disp: , Rfl:  .  cyclobenzaprine (FLEXERIL) 5 MG tablet, Take 5 mg by mouth 3 (three) times daily as needed for muscle spasms., Disp: , Rfl:  .  Diphenhydramine-APAP 25-500 MG TABS, Take 500 mg by mouth every evening., Disp: , Rfl:  .  fenofibrate (TRICOR) 145 MG tablet, Take 145 mg by mouth daily., Disp: , Rfl:  .  fexofenadine (ALLEGRA) 180 MG tablet, Take by mouth., Disp: , Rfl:  .  fluticasone (FLONASE) 50 MCG/ACT nasal spray, Place into the nose., Disp: , Rfl:  .  glipiZIDE (GLUCOTROL) 10 MG tablet, Take 10 mg by mouth daily before  breakfast., Disp: , Rfl:  .  isosorbide mononitrate (IMDUR) 30 MG 24 hr tablet, Take by mouth., Disp: , Rfl:  .  ketotifen (ZADITOR) 0.025 % ophthalmic solution, Apply to eye., Disp: , Rfl:  .  lisinopril (PRINIVIL,ZESTRIL) 10 MG tablet, Take 10 mg by mouth daily., Disp: , Rfl:  .  LORazepam (ATIVAN) 0.5 MG tablet, Take 0.5 mg by mouth every 8 (eight) hours. takr 1/2 tab as needed, Disp: , Rfl:  .  metFORMIN (GLUMETZA) 1000 MG (MOD) 24 hr tablet, Take 1,000 mg by mouth 2 (two) times daily with a meal., Disp: , Rfl:  .  montelukast (SINGULAIR) 10 MG tablet, Take by mouth., Disp: , Rfl:  .  nitroGLYCERIN (NITROSTAT) 0.4 MG SL tablet, Place 0.4 mg under the tongue every 5 (five) minutes as needed for chest pain., Disp: , Rfl:  .  pantoprazole (PROTONIX) 40 MG tablet, Take 40 mg by mouth daily., Disp: , Rfl:  .  Propylhexedrine (BENZEDREX) INHA, Place into the nose., Disp: , Rfl:  .  rosuvastatin (CRESTOR) 40 MG tablet, Take by mouth., Disp: , Rfl:  .  ticagrelor (BRILINTA) 90 MG TABS tablet, Take by mouth., Disp: , Rfl:   Past Medical History: Past Medical History:  Diagnosis Date  . Anxiety   . Coronary artery disease   . Diabetes mellitus without complication ([place])   .  Erectile dysfunction   . GERD (gastroesophageal reflux disease)   . Glaucoma   . Hyperlipidemia   . Hypertension   . Insomnia   . Myocardial infarct (HCC)     Tobacco Use: Social History   Tobacco Use  Smoking Status Former Smoker  . Last attempt to quit: 07/23/1983  . Years since quitting: 34.6  Smokeless Tobacco Never Used    Labs: Recent Review Flowsheet Data    There is no flowsheet data to display.       Exercise Target Goals:    Exercise Program Goal: Individual exercise prescription set using results from initial 6 min walk test and THRR while considering  patient's activity barriers and safety.   Exercise Prescription Goal: Initial exercise prescription builds to 30-45 minutes a day of aerobic  activity, 2-3 days per week.  Home exercise guidelines will be given to patient during program as part of exercise prescription that the participant will acknowledge.  Activity Barriers & Risk Stratification: Activity Barriers & Cardiac Risk Stratification - 12/05/17 1440      Activity Barriers & Cardiac Risk Stratification   Activity Barriers  Arthritis;Back Problems;Shortness of Breath;Chest Pain/Angina;Joint Problems    Cardiac Risk Stratification  High       6 Minute Walk: 6 Minute Walk    Row Name 12/05/17 1436 02/12/18 0852       6 Minute Walk   Phase  -  Discharge    Distance  1220 feet  1620 feet    Distance % Change  -  32.8 %    Distance Feet Change  -  400 ft    Walk Time  6 minutes  6 minutes    # of Rest Breaks  0  0    MPH  2.31  3.07    METS  2.9  3.72    RPE  9  13    Perceived Dyspnea   0  -    VO2 Peak  10.2  13.02    Symptoms  No  No    Resting HR  81 bpm  92 bpm    Resting BP  118/62  132/64    Resting Oxygen Saturation   96 %  -    Exercise Oxygen Saturation  during 6 min walk  97 %  -    Max Ex. HR  116 bpm  117 bpm    Max Ex. BP  150/64  164/82    2 Minute Post BP  130/64  -       Oxygen Initial Assessment:   Oxygen Re-Evaluation:   Oxygen Discharge (Final Oxygen Re-Evaluation):   Initial Exercise Prescription: Initial Exercise Prescription - 12/05/17 1400      Date of Initial Exercise RX and Referring Provider   Date  12/05/17    Referring Provider  Tobe Sos      Treadmill   MPH  2.3    Grade  0.5    Minutes  15    METs  2.9      REL-XR   Level  3    Speed  50    Minutes  15    METs  2.9      T5 Nustep   Level  2    SPM  80    Minutes  15    METs  2.9      Prescription Details   Frequency (times per week)  3    Duration  Progress to 45 minutes  of aerobic exercise without signs/symptoms of physical distress      Intensity   THRR 40-80% of Max Heartrate  109-137    Ratings of Perceived Exertion  11-13    Perceived  Dyspnea  0-4      Resistance Training   Training Prescription  Yes    Weight  3 lb    Reps  10-15       Perform Capillary Blood Glucose checks as needed.  Exercise Prescription Changes: Exercise Prescription Changes    Row Name 12/05/17 1400 12/16/17 1500 12/18/17 0800 01/01/18 1400 01/14/18 1000     Response to Exercise   Blood Pressure (Admit)  118/62  128/72  -  124/72  150/80   Blood Pressure (Exercise)  150/64  124/56  -  144/88  146/84   Blood Pressure (Exit)  130/64  120/70  -  142/80  126/60   Heart Rate (Admit)  80 bpm  85 bpm  -  88 bpm  97 bpm   Heart Rate (Exercise)  119 bpm  116 bpm  -  119 bpm  118 bpm   Heart Rate (Exit)  81 bpm  97 bpm  -  86 bpm  97 bpm   Oxygen Saturation (Admit)  96 %  -  -  -  -   Oxygen Saturation (Exit)  97 %  -  -  -  -   Rating of Perceived Exertion (Exercise)  9  12  -  14  14   Symptoms  -  none  -  none  none   Comments  -  second full day of exericse  -  -  -   Duration  -  Continue with 45 min of aerobic exercise without signs/symptoms of physical distress.  -  Continue with 45 min of aerobic exercise without signs/symptoms of physical distress.  Continue with 45 min of aerobic exercise without signs/symptoms of physical distress.   Intensity  -  THRR unchanged  -  THRR unchanged  THRR unchanged     Progression   Progression  -  Continue to progress workloads to maintain intensity without signs/symptoms of physical distress.  -  Continue to progress workloads to maintain intensity without signs/symptoms of physical distress.  Continue to progress workloads to maintain intensity without signs/symptoms of physical distress.   Average METs  -  2.51  -  3.19  3.19     Resistance Training   Training Prescription  -  Yes  -  Yes  Yes   Weight  -  3 lb  -  3 lb  4 lbs   Reps  -  10-15  -  10-15  10-15     Interval Training   Interval Training  -  No  -  No  No     Treadmill   MPH  -  2.3  -  2.3  2.5   Grade  -  0.5  -  1  1.5    Minutes  -  15  -  15  15   METs  -  2.92  -  3.17  3.43     REL-XR   Level  -  3  -  3  7   Minutes  -  15  -  15  15   METs  -  2.4  -  3.7  3.8     T5 Nustep   Level  -  2  -  2  3   Minutes  -  15  -  15  15   METs  -  2.2  -  3.4  2.5     Home Exercise Plan   Plans to continue exercise at  -  -  Home (comment) walking and stairs  Home (comment) walking and stairs  Home (comment) walking and stairs   Frequency  -  -  Add 3 additional days to program exercise sessions.  Add 3 additional days to program exercise sessions.  Add 3 additional days to program exercise sessions.   Initial Home Exercises Provided  -  -  12/18/17  12/18/17  12/18/17   Row Name 01/29/18 1500 02/13/18 0800           Response to Exercise   Blood Pressure (Admit)  128/62  132/64      Blood Pressure (Exercise)  148/72  164/82      Blood Pressure (Exit)  138/74  100/62      Heart Rate (Admit)  99 bpm  89 bpm      Heart Rate (Exercise)  124 bpm  125 bpm      Heart Rate (Exit)  88 bpm  88 bpm      Rating of Perceived Exertion (Exercise)  14  14      Symptoms  none  none      Duration  Continue with 45 min of aerobic exercise without signs/symptoms of physical distress.  Continue with 45 min of aerobic exercise without signs/symptoms of physical distress.      Intensity  THRR unchanged  THRR unchanged        Progression   Progression  Continue to progress workloads to maintain intensity without signs/symptoms of physical distress.  Continue to progress workloads to maintain intensity without signs/symptoms of physical distress.      Average METs  3.41  3.34        Resistance Training   Training Prescription  Yes  Yes      Weight  4 lbs  5 lbs      Reps  10-15  10-15        Interval Training   Interval Training  No  No        Treadmill   MPH  2.5  2.5      Grade  1.5  1.5      Minutes  15  15      METs  3.43  3.43        REL-XR   Level  7  7      Minutes  15  15      METs  4.5  4        T5  Nustep   Level  3  4      Minutes  15  15      METs  2.3  2.6        Home Exercise Plan   Plans to continue exercise at  Home (comment) walking and stairs  Home (comment) walking and stairs      Frequency  Add 3 additional days to program exercise sessions.  Add 3 additional days to program exercise sessions.      Initial Home Exercises Provided  12/18/17  12/18/17         Exercise Comments: Exercise Comments    Row Name 12/11/17 0736           Exercise Comments  First full  day of exercise!  Patient was oriented to gym and equipment including functions, settings, policies, and procedures.  Patient's individual exercise prescription and treatment plan were reviewed.  All starting workloads were established based on the results of the 6 minute walk test done at initial orientation visit.  The plan for exercise progression was also introduced and progression will be customized based on patient's performance and goals.          Exercise Goals and Review: Exercise Goals    Row Name 12/05/17 1435             Exercise Goals   Increase Physical Activity  Yes       Intervention  Provide advice, education, support and counseling about physical activity/exercise needs.;Develop an individualized exercise prescription for aerobic and resistive training based on initial evaluation findings, risk stratification, comorbidities and participant's personal goals.       Expected Outcomes  Achievement of increased cardiorespiratory fitness and enhanced flexibility, muscular endurance and strength shown through measurements of functional capacity and personal statement of participant.       Increase Strength and Stamina  Yes       Intervention  Provide advice, education, support and counseling about physical activity/exercise needs.;Develop an individualized exercise prescription for aerobic and resistive training based on initial evaluation findings, risk stratification, comorbidities and  participant's personal goals.       Expected Outcomes  Achievement of increased cardiorespiratory fitness and enhanced flexibility, muscular endurance and strength shown through measurements of functional capacity and personal statement of participant.       Able to understand and use rate of perceived exertion (RPE) scale  Yes       Intervention  Provide education and explanation on how to use RPE scale       Expected Outcomes  Short Term: Able to use RPE daily in rehab to express subjective intensity level;Long Term:  Able to use RPE to guide intensity level when exercising independently       Able to understand and use Dyspnea scale  Yes       Intervention  Provide education and explanation on how to use Dyspnea scale       Expected Outcomes  Short Term: Able to use Dyspnea scale daily in rehab to express subjective sense of shortness of breath during exertion;Long Term: Able to use Dyspnea scale to guide intensity level when exercising independently       Knowledge and understanding of Target Heart Rate Range (THRR)  Yes       Intervention  Provide education and explanation of THRR including how the numbers were predicted and where they are located for reference       Expected Outcomes  Long Term: Able to use THRR to govern intensity when exercising independently;Short Term: Able to state/look up THRR;Short Term: Able to use daily as guideline for intensity in rehab       Able to check pulse independently  Yes       Intervention  Provide education and demonstration on how to check pulse in carotid and radial arteries.;Review the importance of being able to check your own pulse for safety during independent exercise       Expected Outcomes  Short Term: Able to explain why pulse checking is important during independent exercise;Long Term: Able to check pulse independently and accurately       Understanding of Exercise Prescription  Yes       Intervention  Provide education, explanation, and  written  materials on patient's individual exercise prescription       Expected Outcomes  Short Term: Able to explain program exercise prescription;Long Term: Able to explain home exercise prescription to exercise independently          Exercise Goals Re-Evaluation : Exercise Goals Re-Evaluation    Row Name 12/11/17 0738 12/16/17 1557 12/18/17 0847 01/01/18 1455 01/03/18 0813     Exercise Goal Re-Evaluation   Exercise Goals Review  Understanding of Exercise Prescription;Knowledge and understanding of Target Heart Rate Range (THRR);Able to understand and use rate of perceived exertion (RPE) scale  Increase Physical Activity;Increase Strength and Stamina;Understanding of Exercise Prescription  Increase Physical Activity;Increase Strength and Stamina;Understanding of Exercise Prescription;Knowledge and understanding of Target Heart Rate Range (THRR);Able to understand and use rate of perceived exertion (RPE) scale;Able to check pulse independently  Increase Physical Activity;Understanding of Exercise Prescription;Increase Strength and Stamina  Increase Physical Activity;Increase Strength and Stamina;Able to understand and use rate of perceived exertion (RPE) scale   Comments  Reviewed RPE scale, THR and program prescription with pt today.  Pt voiced understanding and was given a copy of goals to take home.   Peter Morales is off to a good start in rehab.  He has completed two full days of exercise.  We will continue to monitor his progression.   Reviewed home exercise with pt today.  Pt plans to walk at home and stores for exercise.  Reviewed THR, pulse, RPE, sign and symptoms, NTG use, and when to call 911 or MD.  Also discussed weather considerations and indoor options.  Pt voiced understanding.  Peter Morales has been doing well in rehab.  He has been able to return to rehab after being out sick.  He has come back to his normal workloads.  We will continue to monitor his progression.  Pt can tell he has less shortness of breath  and his knees feel better going up stairs.  He walks while he is on the phone.     Expected Outcomes  Short: Use RPE daily to regulate intensity.  Long: Follow program prescription in THR.  Short: Continue to attend rehab regularly.  Long: Continue to follow program exercise prescription.   Short: Add in at least one extra day of home exercise.  Long: Continue to exercise more.   Short: Begin to increase workloads now that he is feeling better.  Long: Continue to exercise to build strength and stamina.   Short - Pt will continue to attend class to strengthen legs He will add some leg extensions if sitting for a long period  Long - Pt will build strength in legs and reduce knee pain long term   Row Name 01/14/18 1036 01/29/18 1459 01/31/18 0749 02/13/18 0835       Exercise Goal Re-Evaluation   Exercise Goals Review  Increase Physical Activity;Increase Strength and Stamina;Able to understand and use rate of perceived exertion (RPE) scale  Increase Physical Activity;Increase Strength and Stamina;Understanding of Exercise Prescription  Increase Physical Activity;Increase Strength and Stamina;Understanding of Exercise Prescription  Increase Physical Activity;Increase Strength and Stamina;Understanding of Exercise Prescription    Comments  Peter Morales has been doing well in rehab.  He is now up to level 7 on the XR.  We will continue to monitor his progression.   Peter Morales continues to do well in rehab.  He continues to be at an RPE of 14 on most pieces, but his METs are starting to improve more.  We will continue to monitor his progression.  Peter Morales is doing well in rehab.  He can tell that it is making a difference.  He is able to do more and not get as fatigued and he is now able to use the stairs easier.  He is ready to start doing more at home with wall push ups and squats.  He has been walking with the dog some, but with the spring coming he should be able to get out a little more frequently.    Peter Morales has been doing  well in rehab.  He is nearing graduation and improved post 6MWT by 449f!!!  He is planning to continue to walk after graduation.  He is also meeting with a pulmonologist today and plans to talk to him about the possiblity of joining pulmonary rehab after graduation.  We will continue to monitor his progress during class.     Expected Outcomes  Short: Continue to work increase workloads.  Long: Continue to work on leg strength  Short: Continue to increase workload on T5 NuStep.  Long: Continue to work on bAnimator   Short: Try to get out to walk and do more exercise at home.  Long: Continue to build strength and stamina.   Short: Talk about pulmonary rehab.  Long: Continue to exercise even after graduation.        Discharge Exercise Prescription (Final Exercise Prescription Changes): Exercise Prescription Changes - 02/13/18 0800      Response to Exercise   Blood Pressure (Admit)  132/64    Blood Pressure (Exercise)  164/82    Blood Pressure (Exit)  100/62    Heart Rate (Admit)  89 bpm    Heart Rate (Exercise)  125 bpm    Heart Rate (Exit)  88 bpm    Rating of Perceived Exertion (Exercise)  14    Symptoms  none    Duration  Continue with 45 min of aerobic exercise without signs/symptoms of physical distress.    Intensity  THRR unchanged      Progression   Progression  Continue to progress workloads to maintain intensity without signs/symptoms of physical distress.    Average METs  3.34      Resistance Training   Training Prescription  Yes    Weight  5 lbs    Reps  10-15      Interval Training   Interval Training  No      Treadmill   MPH  2.5    Grade  1.5    Minutes  15    METs  3.43      REL-XR   Level  7    Minutes  15    METs  4      T5 Nustep   Level  4    Minutes  15    METs  2.6      Home Exercise Plan   Plans to continue exercise at  Home (comment) walking and stairs    Frequency  Add 3 additional days to program exercise sessions.     Initial Home Exercises Provided  12/18/17       Nutrition:  Target Goals: Understanding of nutrition guidelines, daily intake of sodium <15074m cholesterol <20072mcalories 30% from fat and 7% or less from saturated fats, daily to have 5 or more servings of fruits and vegetables.  Biometrics: Pre Biometrics - 12/05/17 1434      Pre Biometrics   Height  5' 6"  (1.676 m)    Weight  198  lb 8 oz (90 kg)    Waist Circumference  42 inches    Hip Circumference  42 inches    Waist to Hip Ratio  1 %    BMI (Calculated)  32.05    Single Leg Stand  20.66 seconds      Post Biometrics - 02/12/18 0853       Post  Biometrics   Height  5' 6"  (1.676 m)    Weight  201 lb (91.2 kg)    Waist Circumference  41 inches    Hip Circumference  40 inches    Waist to Hip Ratio  1.02 %    BMI (Calculated)  32.46    Single Leg Stand  30 seconds       Nutrition Therapy Plan and Nutrition Goals: Nutrition Therapy & Goals - 01/08/18 0858      Nutrition Therapy   Diet  TLC    Drug/Food Interactions  Statins/Certain Fruits    Protein (specify units)  140g    Fiber  35 grams    Saturated Fats  15 max. grams    Fruits and Vegetables  5 servings/day    Sodium  1500 grams      Personal Nutrition Goals   Nutrition Goal  Swap your snacks. Try fruit, 1-2 portions of nuts, hummus and veggies, etc. in place of candy    Personal Goal #2  Increase fiber intake to help you feel more full and satisifed. Choose larger portions of vegetables and proteins than starches    Personal Goal #3  Control portions based on the portion sizes discussed today    Comments  has DM type II but does not follow a diabetic diet, has tried to eat less sweets and baked goods however      Intervention Plan   Intervention  Prescribe, educate and counsel regarding individualized specific dietary modifications aiming towards targeted core components such as weight, hypertension, lipid management, diabetes, heart failure and other  comorbidities.    Expected Outcomes  Short Term Goal: Understand basic principles of dietary content, such as calories, fat, sodium, cholesterol and nutrients.;Short Term Goal: A plan has been developed with personal nutrition goals set during dietitian appointment.;Long Term Goal: Adherence to prescribed nutrition plan.       Nutrition Assessments: Nutrition Assessments - 02/12/18 0854      MEDFICTS Scores   Pre Score  24    Post Score  18    Score Difference  -6       Nutrition Goals Re-Evaluation: Nutrition Goals Re-Evaluation    Row Name 01/31/18 0758             Goals   Nutrition Goal  Swap your snacks. Try fruit, 1-2 portions of nuts, hummus and veggies, etc. in place of candy, Increased fiber, more veggies and protein. Portion Control       Comment  Jai has swapped out some of his snacks but still eats sweets more than he should.  He has increased his fiber intake and eating more veggies and protein. He is still working on portion control.  He has been able to cut back but still has work to do.        Expected Outcome  Short: Continue to work on portion sizes and less candy.  Long: Continue to work on increasing healthier snacking.           Nutrition Goals Discharge (Final Nutrition Goals Re-Evaluation): Nutrition Goals Re-Evaluation - 01/31/18 1610  Goals   Nutrition Goal  Swap your snacks. Try fruit, 1-2 portions of nuts, hummus and veggies, etc. in place of candy, Increased fiber, more veggies and protein. Portion Control    Comment  Peter Morales has swapped out some of his snacks but still eats sweets more than he should.  He has increased his fiber intake and eating more veggies and protein. He is still working on portion control.  He has been able to cut back but still has work to do.     Expected Outcome  Short: Continue to work on portion sizes and less candy.  Long: Continue to work on increasing healthier snacking.        Psychosocial: Target Goals:  Acknowledge presence or absence of significant depression and/or stress, maximize coping skills, provide positive support system. Participant is able to verbalize types and ability to use techniques and skills needed for reducing stress and depression.   Initial Review & Psychosocial Screening: Initial Psych Review & Screening - 12/05/17 1437      Initial Review   Current issues with  Current Stress Concerns    Source of Stress Concerns  Occupation    Comments  Bobby's job is very stressful. 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 He knows that this job is very stressful and said each of his heart attacks has happened after a very stressful event so he knows he needs to be careful. He also reported that his wife's health is declining some and he knows his health is not getting better      [place]?  Yes wife, daughters here, sons in [place] and [place]      Screening Interventions   Interventions  Yes;Encouraged to exercise    Expected Outcomes  Short Term goal: Utilizing psychosocial counselor, staff and physician to assist with identification of specific Stressors or current issues interfering with healing process. Setting desired goal for each stressor or current issue identified.;Long Term Goal: Stressors or current issues are controlled or eliminated.;Short Term goal: Identification and review with participant of any Quality of Life or Depression concerns found by scoring the questionnaire.;Long Term goal: The participant improves quality of Life and PHQ9 Scores as seen by post scores and/or verbalization of changes       Quality of Life Scores:  Quality of Life - 02/12/18 0854      Quality of Life Scores   Health/Function Pre  18.77 %    Health/Function Post  21.17 %    Health/Function % Change  12.79 %    Socioeconomic Pre  23.83 %    Socioeconomic Post  22.21 %    Socioeconomic % Change   -6.8 %     Psych/Spiritual Pre  24.93 %    Psych/Spiritual Post  23.93 %    Psych/Spiritual % Change  -4.01 %    Family Pre  27.6 %    Family Post  27.6 %    Family % Change  0 %    GLOBAL Pre  22.33 %    GLOBAL Post  22.9 %    GLOBAL % Change  2.55 %      Scores of 19 and below usually indicate a poorer quality of life in these areas.  A difference of  2-3 points is a clinically meaningful difference.  A difference of 2-3 points in the total score of the Quality of Life Index has  been associated with significant improvement in overall quality of life, self-image, physical symptoms, and general health in studies assessing change in quality of life.  PHQ-9: Recent Review Flowsheet Data    Depression screen [facility] 2/9 02/12/2018 12/05/2017   Decreased Interest 2 1   Down, Depressed, Hopeless 1 0   PHQ - 2 Score 3 1   Altered sleeping 3 3   Tired, decreased energy 2 2   Change in appetite 2 1   Feeling bad or failure about yourself  - 0   Trouble concentrating 0 0   Moving slowly or fidgety/restless 0 0   Suicidal thoughts 0 0   PHQ-9 Score 10 7   Difficult doing work/chores Not difficult at all Somewhat difficult     Interpretation of Total Score  Total Score Depression Severity:  1-4 = Minimal depression, 5-9 = Mild depression, 10-14 = Moderate depression, 15-19 = Moderately severe depression, 20-27 = Severe depression   Psychosocial Evaluation and Intervention: Psychosocial Evaluation - 02/17/18 1026      Discharge Psychosocial Assessment & Intervention   Comments  Counselor met with Peter Morales today for a discharge summary.  He reports making progress in sleeping a little better and having more energy since coming into this program.  He also has enjoyed the educational and psychoeducational information - especially on stress management and nutrition.  Peter Morales continues to have a positive support system with a spouse and two daughters who live locally.  Peter Morales's PHQ-9 scores increased from a "7" to  a "10" - indicating his depressive symptoms have increased since he came into this program.  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  His daughter is also going through a divorce currently and his spouse has some health issues as well as his job stress impacting all of this.  Jayin is in the process of developing a plan for his retirement.  counselor brainstormed this plan and discussed a graduated approach with some parameters to respecting his time in the evenings and on the weekends in the process.  Sarvesh has also scheduled a vacation in April for he and his wife to visit his son in [place] for 10 days.  He plans to walk to maintain his progress made in this program and to join a silver sneakers program upon his return from vacation.  Chanson has agreed to have a sleep study done if his sleep does not continue to improve as he graduates his retirement plan and has less stress.  Counselor commended Peter Morales on his progress made and his self-care plan for his health and less stress in the near future.         Psychosocial Re-Evaluation: Psychosocial Re-Evaluation    Row Name 01/03/18 0818 01/31/18 0755           Psychosocial Re-Evaluation   Current issues with  Current Stress Concerns  Current Stress Concerns      Comments  Pt states main stress is work.  He has started not taking laptop to bed and is working on not sleeping in [place].  He works long hours and with many countries in different time zones.  Peter Morales has been doing well in rehab.  His biggest stressor comes from work.  He put in to resign on 12/31 but they have not accepted it, nor are they looking for a  replacement.  They continue to work him hard and he is also nervous about retiring. He has about 3 weeks left in the program.  He is sleeping a little better as he frequently turns and tosses and  trips to bathroom.  He is trying not to sleep in the recliner as much and is going to bed each night.        Expected Outcomes  Short - not sleeping in recliner, use relaxed breathing to reduce stress Long - Continue to set time boundaries for work and use breathing and exercise to manage stress  Short: Continue to sleep better.  Long: Continue to work on stressors and coping at work.       Interventions  Encouraged to attend Cardiac Rehabilitation for the exercise  Encouraged to attend Cardiac Rehabilitation for the exercise;Stress management education      Continue Psychosocial Services   Follow up required by staff  Follow up required by staff        Initial Review   Source of Stress Concerns  -  Occupation         Psychosocial Discharge (Final Psychosocial Re-Evaluation): Psychosocial Re-Evaluation - 01/31/18 0755      Psychosocial Re-Evaluation   Current issues with  Current Stress Concerns    Comments  Peter Morales has been doing well in rehab.  His biggest stressor comes from work.  He put in to resign on 12/31 but they have not accepted it, nor are they looking for a replacement.  They continue to work him hard and he is also nervous about retiring. He has about 3 weeks left in the program.  He is sleeping a little better as he frequently turns and tosses and trips to bathroom.  He is trying not to sleep in the recliner as much and is going to bed each night.      Expected Outcomes  Short: Continue to sleep better.  Long: Continue to work on stressors and coping at work.     Interventions  Encouraged to attend Cardiac Rehabilitation for the exercise;Stress management education    Continue Psychosocial Services   Follow up required by staff      Initial Review   Source of Stress Concerns  Occupation       Vocational Rehabilitation: Provide vocational rehab assistance to qualifying candidates.   Vocational Rehab Evaluation & Intervention: Vocational Rehab - 12/05/17 1439      Initial  Vocational Rehab Evaluation & Intervention   Assessment shows need for Vocational Rehabilitation  No       Education: Education Goals: Education classes will be provided on a variety of topics geared toward better understanding of heart health and risk factor modification. Participant will state understanding/return demonstration of topics presented as noted by education test scores.  Learning Barriers/Preferences: Learning Barriers/Preferences - 12/05/17 1439      Learning Barriers/Preferences   Learning Barriers  None    Learning Preferences  None       Education Topics:  AED/CPR: - Group verbal and written instruction with the use of models to demonstrate the basic use of the AED with the basic ABC's of resuscitation.   Cardiac Rehab from 02/17/2018 in [facility] Cardiac and Pulmonary Rehab  Date  01/06/18  Educator  SB  Instruction Review Code  1- Verbalizes Understanding      General Nutrition Guidelines/Fats and Fiber: -Group instruction provided by verbal, written material, models and posters to present the general guidelines for heart healthy nutrition. Gives an  explanation and review of dietary fats and fiber.   Cardiac Rehab from 02/17/2018 in [facility] Cardiac and Pulmonary Rehab  Date  02/17/18  Educator  CR  Instruction Review Code  1- Verbalizes Understanding      Controlling Sodium/Reading Food Labels: -Group verbal and written material supporting the discussion of sodium use in heart healthy nutrition. Review and explanation with models, verbal and written materials for utilization of the food label.   Cardiac Rehab from 02/17/2018 in [facility] Cardiac and Pulmonary Rehab  Date  12/30/17  Educator  PI  Instruction Review Code  1- Verbalizes Understanding      Exercise Physiology & General Exercise Guidelines: - Group verbal and written instruction with models to review the exercise physiology of the cardiovascular system and associated critical values. Provides general  exercise guidelines with specific guidelines to those with heart or lung disease.    Cardiac Rehab from 02/17/2018 in [facility] Cardiac and Pulmonary Rehab  Date  01/13/18  Educator  [facility]  Instruction Review Code  1- Verbalizes Understanding      Aerobic Exercise & Resistance Training: - Gives group verbal and written instruction on the various components of exercise. Focuses on aerobic and resistive training programs and the benefits of this training and how to safely progress through these programs..   Flexibility, Balance, Mind/Body Relaxation: Provides group verbal/written instruction on the benefits of flexibility and balance training, including mind/body exercise modes such as yoga, pilates and tai chi.  Demonstration and skill practice provided.   Cardiac Rehab from 02/17/2018 in [facility] Cardiac and Pulmonary Rehab  Date  01/22/18  Educator  [facility]  Instruction Review Code  1- Verbalizes Understanding      Stress and Anxiety: - Provides group verbal and written instruction about the health risks of elevated stress and causes of high stress.  Discuss the correlation between heart/lung disease and anxiety and treatment options. Review healthy ways to manage with stress and anxiety.   Cardiac Rehab from 02/17/2018 in [facility] Cardiac and Pulmonary Rehab  Date  01/29/18  Educator  [facility]  Instruction Review Code  1- Verbalizes Understanding      Depression: - Provides group verbal and written instruction on the correlation between heart/lung disease and depressed mood, treatment options, and the stigmas associated with seeking treatment.   Anatomy & Physiology of the Heart: - Group verbal and written instruction and models provide basic cardiac anatomy and physiology, with the coronary electrical and arterial systems. Review of Valvular disease and Heart Failure   Cardiac Procedures: - Group verbal and written instruction to review commonly prescribed medications for heart disease. Reviews the  medication, class of the drug, and side effects. Includes the steps to properly store meds and maintain the prescription regimen. (beta blockers and nitrates)   Cardiac Medications I: - Group verbal and written instruction to review commonly prescribed medications for heart disease. Reviews the medication, class of the drug, and side effects. Includes the steps to properly store meds and maintain the prescription regimen.   Cardiac Rehab from 02/17/2018 in [facility] Cardiac and Pulmonary Rehab  Date  02/03/18  Educator  SB  Instruction Review Code  1- Verbalizes Understanding      Cardiac Medications II: -Group verbal and written instruction to review commonly prescribed medications for heart disease. Reviews the medication, class of the drug, and side effects. (all other drug classes)   Cardiac Rehab from 02/17/2018 in [facility] Cardiac and Pulmonary Rehab  Date  01/27/18  Educator  [facility]  Instruction  Review Code  1- Verbalizes Understanding       Go Sex-Intimacy & Heart Disease, Get SMART - Goal Setting: - Group verbal and written instruction through game format to discuss heart disease and the return to sexual intimacy. Provides group verbal and written material to discuss and apply goal setting through the application of the S.M.A.R.T. Method.   Other Matters of the Heart: - Provides group verbal, written materials and models to describe Stable Angina and Peripheral Artery. Includes description of the disease process and treatment options available to the cardiac patient.   Exercise & Equipment Safety: - Individual verbal instruction and demonstration of equipment use and safety with use of the equipment.   Cardiac Rehab from 02/17/2018 in [facility] Cardiac and Pulmonary Rehab  Date  12/05/17  Educator  [facility]  Instruction Review Code  1- Verbalizes Understanding      Infection Prevention: - Provides verbal and written material to individual with discussion of infection control including proper  hand washing and proper equipment cleaning during exercise session.   Cardiac Rehab from 02/17/2018 in [facility] Cardiac and Pulmonary Rehab  Date  12/05/17  Educator  [facility]  Instruction Review Code  1- Verbalizes Understanding      Falls Prevention: - Provides verbal and written material to individual with discussion of falls prevention and safety.   Cardiac Rehab from 02/17/2018 in [facility] Cardiac and Pulmonary Rehab  Date  12/05/17  Educator  [facility]  Instruction Review Code  1- Verbalizes Understanding      Diabetes: - Individual verbal and written instruction to review signs/symptoms of diabetes, desired ranges of glucose level fasting, after meals and with exercise. Acknowledge that pre and post exercise glucose checks will be done for 3 sessions at entry of program.   Cardiac Rehab from 02/17/2018 in [facility] Cardiac and Pulmonary Rehab  Date  12/05/17  Educator  [facility]  Instruction Review Code  1- Verbalizes Understanding      Know Your Numbers and Risk Factors: -Group verbal and written instruction about important numbers in your health.  Discussion of what are risk factors and how they play a role in the disease process.  Review of Cholesterol, Blood Pressure, Diabetes, and BMI and the role they play in your overall health.   Cardiac Rehab from 02/17/2018 in [facility] Cardiac and Pulmonary Rehab  Date  01/27/18  Educator  [facility]  Instruction Review Code  1- Verbalizes Understanding      Sleep Hygiene: -Provides group verbal and written instruction about how sleep can affect your health.  Define sleep hygiene, discuss sleep cycles and impact of sleep habits. Review good sleep hygiene tips.    Cardiac Rehab from 02/17/2018 in [facility] Cardiac and Pulmonary Rehab  Date  02/12/18  Educator  [facility]  Instruction Review Code  1- Verbalizes Understanding      Other: -Provides group and verbal instruction on various topics (see comments)   Knowledge Questionnaire Score: Knowledge Questionnaire Score - 02/12/18  0853      Knowledge Questionnaire Score   Pre Score  23/28    Post Score  21/28       Core Components/Risk Factors/Patient Goals at Admission: Personal Goals and Risk Factors at Admission - 12/05/17 1433      Core Components/Risk Factors/Patient Goals on Admission    Weight Management  Yes;Obesity;Weight Loss    Intervention  Weight Management: Develop a combined nutrition and exercise program designed to reach desired caloric intake, while maintaining appropriate intake of nutrient and fiber, sodium  and fats, and appropriate energy expenditure required for the weight goal.;Weight Management: Provide education and appropriate resources to help participant work on and attain dietary goals.;Weight Management/Obesity: Establish reasonable short term and long term weight goals.;Obesity: Provide education and appropriate resources to help participant work on and attain dietary goals.    Admit Weight  198 lb 8 oz (90 kg)    Goal Weight: Short Term  194 lb (88 kg)    Goal Weight: Long Term  170 lb (77.1 kg)    Expected Outcomes  Short Term: Continue to assess and modify interventions until short term weight is achieved;Long Term: Adherence to nutrition and physical activity/exercise program aimed toward attainment of established weight goal;Weight Loss: Understanding of general recommendations for a balanced deficit meal plan, which promotes 1-2 lb weight loss per week and includes a negative energy balance of [phone] kcal/d;Understanding recommendations for meals to include 15-35% energy as protein, 25-35% energy from fat, 35-60% energy from carbohydrates, less than 282m of dietary cholesterol, 20-35 gm of total fiber daily;Understanding of distribution of calorie intake throughout the day with the consumption of 4-5 meals/snacks    Diabetes  Yes    Intervention  Provide education about signs/symptoms and action to take for hypo/hyperglycemia.;Provide education about proper nutrition, including  hydration, and aerobic/resistive exercise prescription along with prescribed medications to achieve blood glucose in normal ranges: Fasting glucose 65-99 mg/dL    Expected Outcomes  Short Term: Participant verbalizes understanding of the signs/symptoms and immediate care of hyper/hypoglycemia, proper foot care and importance of medication, aerobic/resistive exercise and nutrition plan for blood glucose control.;Long Term: Attainment of HbA1C < 7%.    Hypertension  Yes    Intervention  Provide education on lifestyle modifcations including regular physical activity/exercise, weight management, moderate sodium restriction and increased consumption of fresh fruit, vegetables, and low fat dairy, alcohol moderation, and smoking cessation.;Monitor prescription use compliance.    Expected Outcomes  Short Term: Continued assessment and intervention until BP is < 140/982mHG in hypertensive participants. < 130/8051mG in hypertensive participants with diabetes, heart failure or chronic kidney disease.;Long Term: Maintenance of blood pressure at goal levels.    Lipids  Yes    Intervention  Provide education and support for participant on nutrition & aerobic/resistive exercise along with prescribed medications to achieve LDL <25m83mDL >40mg23m Expected Outcomes  Short Term: Participant states understanding of desired cholesterol values and is compliant with medications prescribed. Participant is following exercise prescription and nutrition guidelines.;Long Term: Cholesterol controlled with medications as prescribed, with individualized exercise RX and with personalized nutrition plan. Value goals: LDL < 25mg,34m > 40 mg.    Stress  Yes Peter Morales's job is very stressful. His wife's health is also declining some. His doctor told him 60% of his heart issues are caused by genetics which is discouraging to him.     Intervention  Offer individual and/or small group education and counseling on adjustment to heart disease,  stress management and health-related lifestyle change. Teach and support self-help strategies.;Refer participants experiencing significant psychosocial distress to appropriate mental health specialists for further evaluation and treatment. When possible, include family members and significant others in education/counseling sessions.    Expected Outcomes  Short Term: Participant demonstrates changes in health-related behavior, relaxation and other stress management skills, ability to obtain effective social support, and compliance with psychotropic medications if prescribed.;Long Term: Emotional wellbeing is indicated by absence of clinically significant psychosocial distress or social isolation.  Core Components/Risk Factors/Patient Goals Review:  Goals and Risk Factor Review    Row Name 01/03/18 0809 01/31/18 0752           Core Components/Risk Factors/Patient Goals Review   Personal Goals Review  Weight Management/Obesity;Diabetes;Hypertension;Lipids  Weight Management/Obesity;Diabetes;Hypertension;Lipids      Review  Peter Morales reports his A1C stays steady and he doesnt experience high or low BG.  He is scheduled to meet with the RD 2/13.  He eats a Camera operator diet.  He can tell a differnec in going up stairs (much easier) and less SOB.    Jashon has been doing well in rehab.  His weight continues to yoyo, but he would like to get down to 194lbs.  We talked about adding in more home exercise, but he is also concerned about the stress at work. His blood pressures have been good.  He has a machine at home to check it but doesn't trust it completely.  We talked about bringing it in to check.  He has not been checking his blood sugars but has not had any feelings of being too low .  He has not had any problems with his medications.       Expected Outcomes  Short - Pt will meet with RD and Make adjustments to improve his diet.  Long - Pt will control DM and A1C long term  Short: Bring in cuff to  check with [place].  Long: Continue to work on risk factor modifications.          Core Components/Risk Factors/Patient Goals at Discharge (Final Review):  Goals and Risk Factor Review - 01/31/18 0752      Core Components/Risk Factors/Patient Goals Review   Personal Goals Review  Weight Management/Obesity;Diabetes;Hypertension;Lipids    Review  Peter Morales has been doing well in rehab.  His weight continues to yoyo, but he would like to get down to 194lbs.  We talked about adding in more home exercise, but he is also concerned about the stress at work. His blood pressures have been good.  He has a machine at home to check it but doesn't trust it completely.  We talked about bringing it in to check.  He has not been checking his blood sugars but has not had any feelings of being too low .  He has not had any problems with his medications.     Expected Outcomes  Short: Bring in cuff to check with [place].  Long: Continue to work on risk factor modifications.        ITP Comments: ITP Comments    Row Name 12/05/17 1202 12/25/17 0601 01/22/18 0645 02/19/18 0610     ITP Comments  medical review completed. ITP sent to Dr Elta Guadeloupe Sabra Heck for review,changes as needed and signature   Documentation of diagnosis can be found in [facility] 11/05/2017 hospital admit  30 Day review. Continue with ITP unless directed changes per Medical Director review. New to program.    30 day review completed. Continue with ITP unless diercted changes per Medical Director.  30 Day review. Continue with ITP unless directed changes per Medical Director review.         Comments:

## 2018-02-19 NOTE — Progress Notes (Signed)
Daily Session Note  Patient Details  Name: Peter Morales MRN: [id number] Date of Birth: 1951-01-10 Referring Provider:     Cardiac Rehab from 12/05/2017 in [facility] Cardiac [facility]  Referring Provider  Tobe Sos      Encounter Date: 02/19/2018  Check In: Session Check In - 02/19/18 0746      Check-In   Location  [facility]-Cardiac & Pulmonary Rehab    Staff Present  Justin Mend RCP,RRT,BSRT;Heath Lark, RN, BSN, CCRP;Jessica Luan Pulling, MA, RCEP, CCRP, Exercise Physiologist    Supervising physician immediately available to respond to emergencies  See telemetry face sheet for immediately available ER MD    Medication changes reported      No    Fall or balance concerns reported     No    Tobacco Cessation  No Change    Warm-up and Cool-down  Performed on first and last piece of equipment    Resistance Training Performed  Yes    VAD Patient?  No      Pain Assessment   Currently in Pain?  No/denies          Social History   Tobacco Use  Smoking Status Former Smoker  . Last attempt to quit: 07/23/1983  . Years since quitting: 34.6  Smokeless Tobacco Never Used    Goals Met:  Independence with exercise equipment Exercise tolerated well No report of cardiac concerns or symptoms Strength training completed today  Goals Unmet:  Not Applicable  Comments:  Peter Morales graduated today from  rehab with 35 sessions completed.  Details of the patient's exercise prescription and what He needs to do in order to continue the prescription and progress were discussed with patient.  Patient was given a copy of prescription and goals.  Patient verbalized understanding.  Peter Morales plans to continue to exercise by walking at home.   Dr. Emily Filbert is Medical Director for [facility].

## 2018-02-19 NOTE — Progress Notes (Signed)
Discharge Progress Report  Patient Details  Name: Peter Morales MRN: [id number] Date of Birth: 1951-08-27 Referring Provider:     Cardiac Rehab from 12/05/2017 in [facility] Cardiac and Pulmonary Rehab  Referring Provider  Tobe Sos       Number of Visits: 35/36  Reason for Discharge:  Patient reached a stable level of exercise. Patient independent in their exercise. Patient has met program and personal goals.  Smoking History:  Social History   Tobacco Use  Smoking Status Former Smoker  . Last attempt to quit: 07/23/1983  . Years since quitting: 34.6  Smokeless Tobacco Never Used    Diagnosis:  NSTEMI (non-ST elevated myocardial infarction) ([place])  ADL UCSD:   Initial Exercise Prescription: Initial Exercise Prescription - 12/05/17 1400      Date of Initial Exercise RX and Referring Provider   Date  12/05/17    Referring Provider  Tobe Sos      Treadmill   MPH  2.3    Grade  0.5    Minutes  15    METs  2.9      REL-XR   Level  3    Speed  50    Minutes  15    METs  2.9      T5 Nustep   Level  2    SPM  80    Minutes  15    METs  2.9      Prescription Details   Frequency (times per week)  3    Duration  Progress to 45 minutes of aerobic exercise without signs/symptoms of physical distress      Intensity   THRR 40-80% of Max Heartrate  109-137    Ratings of Perceived Exertion  11-13    Perceived Dyspnea  0-4      Resistance Training   Training Prescription  Yes    Weight  3 lb    Reps  10-15       Discharge Exercise Prescription (Final Exercise Prescription Changes): Exercise Prescription Changes - 02/13/18 0800      Response to Exercise   Blood Pressure (Admit)  132/64    Blood Pressure (Exercise)  164/82    Blood Pressure (Exit)  100/62    Heart Rate (Admit)  89 bpm    Heart Rate (Exercise)  125 bpm    Heart Rate (Exit)  88 bpm    Rating of Perceived Exertion (Exercise)  14    Symptoms  none    Duration  Continue with 45 min of aerobic  exercise without signs/symptoms of physical distress.    Intensity  THRR unchanged      Progression   Progression  Continue to progress workloads to maintain intensity without signs/symptoms of physical distress.    Average METs  3.34      Resistance Training   Training Prescription  Yes    Weight  5 lbs    Reps  10-15      Interval Training   Interval Training  No      Treadmill   MPH  2.5    Grade  1.5    Minutes  15    METs  3.43      REL-XR   Level  7    Minutes  15    METs  4      T5 Nustep   Level  4    Minutes  15    METs  2.6      Home Exercise  Plan   Plans to continue exercise at  Home (comment) walking and stairs    Frequency  Add 3 additional days to program exercise sessions.    Initial Home Exercises Provided  12/18/17       Functional Capacity: 6 Minute Walk    Row Name 12/05/17 1436 02/12/18 0852       6 Minute Walk   Phase  -  Discharge    Distance  1220 feet  1620 feet    Distance % Change  -  32.8 %    Distance Feet Change  -  400 ft    Walk Time  6 minutes  6 minutes    # of Rest Breaks  0  0    MPH  2.31  3.07    METS  2.9  3.72    RPE  9  13    Perceived Dyspnea   0  -    VO2 Peak  10.2  13.02    Symptoms  No  No    Resting HR  81 bpm  92 bpm    Resting BP  118/62  132/64    Resting Oxygen Saturation   96 %  -    Exercise Oxygen Saturation  during 6 min walk  97 %  -    Max Ex. HR  116 bpm  117 bpm    Max Ex. BP  150/64  164/82    2 Minute Post BP  130/64  -       Psychological, QOL, Others - Outcomes: PHQ 2/9: Depression screen [facility] 2/9 02/12/2018 12/05/2017  Decreased Interest 2 1  Down, Depressed, Hopeless 1 0  PHQ - 2 Score 3 1  Altered sleeping 3 3  Tired, decreased energy 2 2  Change in appetite 2 1  Feeling bad or failure about yourself  - 0  Trouble concentrating 0 0  Moving slowly or fidgety/restless 0 0  Suicidal thoughts 0 0  PHQ-9 Score 10 7  Difficult doing work/chores Not difficult at all Somewhat  difficult    Quality of Life: Quality of Life - 02/12/18 0854      Quality of Life Scores   Health/Function Pre  18.77 %    Health/Function Post  21.17 %    Health/Function % Change  12.79 %    Socioeconomic Pre  23.83 %    Socioeconomic Post  22.21 %    Socioeconomic % Change   -6.8 %    Psych/Spiritual Pre  24.93 %    Psych/Spiritual Post  23.93 %    Psych/Spiritual % Change  -4.01 %    Family Pre  27.6 %    Family Post  27.6 %    Family % Change  0 %    GLOBAL Pre  22.33 %    GLOBAL Post  22.9 %    GLOBAL % Change  2.55 %       Personal Goals: Goals established at orientation with interventions provided to work toward goal. Personal Goals and Risk Factors at Admission - 12/05/17 1433      Core Components/Risk Factors/Patient Goals on Admission    Weight Management  Yes;Obesity;Weight Loss    Intervention  Weight Management: Develop a combined nutrition and exercise program designed to reach desired caloric intake, while maintaining appropriate intake of nutrient and fiber, sodium and fats, and appropriate energy expenditure required for the weight goal.;Weight Management: Provide education and appropriate resources to help participant work on  and attain dietary goals.;Weight Management/Obesity: Establish reasonable short term and long term weight goals.;Obesity: Provide education and appropriate resources to help participant work on and attain dietary goals.    Admit Weight  198 lb 8 oz (90 kg)    Goal Weight: Short Term  194 lb (88 kg)    Goal Weight: Long Term  170 lb (77.1 kg)    Expected Outcomes  Short Term: Continue to assess and modify interventions until short term weight is achieved;Long Term: Adherence to nutrition and physical activity/exercise program aimed toward attainment of established weight goal;Weight Loss: Understanding of general recommendations for a balanced deficit meal plan, which promotes 1-2 lb weight loss per week and includes a negative energy  balance of [phone] kcal/d;Understanding recommendations for meals to include 15-35% energy as protein, 25-35% energy from fat, 35-60% energy from carbohydrates, less than 280m of dietary cholesterol, 20-35 gm of total fiber daily;Understanding of distribution of calorie intake throughout the day with the consumption of 4-5 meals/snacks    Diabetes  Yes    Intervention  Provide education about signs/symptoms and action to take for hypo/hyperglycemia.;Provide education about proper nutrition, including hydration, and aerobic/resistive exercise prescription along with prescribed medications to achieve blood glucose in normal ranges: Fasting glucose 65-99 mg/dL    Expected Outcomes  Short Term: Participant verbalizes understanding of the signs/symptoms and immediate care of hyper/hypoglycemia, proper foot care and importance of medication, aerobic/resistive exercise and nutrition plan for blood glucose control.;Long Term: Attainment of HbA1C < 7%.    Hypertension  Yes    Intervention  Provide education on lifestyle modifcations including regular physical activity/exercise, weight management, moderate sodium restriction and increased consumption of fresh fruit, vegetables, and low fat dairy, alcohol moderation, and smoking cessation.;Monitor prescription use compliance.    Expected Outcomes  Short Term: Continued assessment and intervention until BP is < 140/964mHG in hypertensive participants. < 130/8062mG in hypertensive participants with diabetes, heart failure or chronic kidney disease.;Long Term: Maintenance of blood pressure at goal levels.    Lipids  Yes    Intervention  Provide education and support for participant on nutrition & aerobic/resistive exercise along with prescribed medications to achieve LDL <24m50mDL >40mg27m Expected Outcomes  Short Term: Participant states understanding of desired cholesterol values and is compliant with medications prescribed. Participant is following exercise  prescription and nutrition guidelines.;Long Term: Cholesterol controlled with medications as prescribed, with individualized exercise RX and with personalized nutrition plan. Value goals: LDL < 24mg,92m > 40 mg.    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 His wife's health is also declining some. His doctor told him 60% of his heart issues are caused by genetics which is discouraging to him.     Intervention  Offer individual and/or small group education and counseling on adjustment to heart disease, stress management and health-related lifestyle change. Teach and support self-help strategies.;Refer participants experiencing significant psychosocial distress to appropriate mental health specialists for further evaluation and treatment. When possible, include family members and significant others in education/counseling sessions.    Expected Outcomes  Short Term: Participant demonstrates changes in health-related behavior, relaxation and other stress management skills, ability to obtain effective social support, and compliance with psychotropic medications if prescribed.;Long Term: Emotional wellbeing is indicated by absence of clinically significant psychosocial distress or social isolation.        Personal Goals Discharge: Goals and Risk Factor Review    Row Name 01/03/18 0809 [id number]  Core Components/Risk Factors/Patient Goals Review   Personal Goals Review  Weight Management/Obesity;Diabetes;Hypertension;Lipids  Weight Management/Obesity;Diabetes;Hypertension;Lipids      Review  Kazmir reports his A1C stays steady and he doesnt experience high or low BG.  He is scheduled to meet with the RD 2/13.  He eats a Camera operator diet.  He can tell a differnec in going up stairs (much easier) and less SOB.    Bertin has been doing well in rehab.  His weight continues to yoyo, but he would like to get down to 194lbs.  We talked about adding in more home exercise, but he is also concerned  about the stress at work. His blood pressures have been good.  He has a machine at home to check it but doesn't trust it completely.  We talked about bringing it in to check.  He has not been checking his blood sugars but has not had any feelings of being too low .  He has not had any problems with his medications.       Expected Outcomes  Short - Pt will meet with RD and Make adjustments to improve his diet.  Long - Pt will control DM and A1C long term  Short: Bring in cuff to check with [place].  Long: Continue to work on risk factor modifications.          Exercise Goals and Review: Exercise Goals    Row Name 12/05/17 1435             Exercise Goals   Increase Physical Activity  Yes       Intervention  Provide advice, education, support and counseling about physical activity/exercise needs.;Develop an individualized exercise prescription for aerobic and resistive training based on initial evaluation findings, risk stratification, comorbidities and participant's personal goals.       Expected Outcomes  Achievement of increased cardiorespiratory fitness and enhanced flexibility, muscular endurance and strength shown through measurements of functional capacity and personal statement of participant.       Increase Strength and Stamina  Yes       Intervention  Provide advice, education, support and counseling about physical activity/exercise needs.;Develop an individualized exercise prescription for aerobic and resistive training based on initial evaluation findings, risk stratification, comorbidities and participant's personal goals.       Expected Outcomes  Achievement of increased cardiorespiratory fitness and enhanced flexibility, muscular endurance and strength shown through measurements of functional capacity and personal statement of participant.       Able to understand and use rate of perceived exertion (RPE) scale  Yes       Intervention  Provide education and explanation on how to use RPE  scale       Expected Outcomes  Short Term: Able to use RPE daily in rehab to express subjective intensity level;Long Term:  Able to use RPE to guide intensity level when exercising independently       Able to understand and use Dyspnea scale  Yes       Intervention  Provide education and explanation on how to use Dyspnea scale       Expected Outcomes  Short Term: Able to use Dyspnea scale daily in rehab to express subjective sense of shortness of breath during exertion;Long Term: Able to use Dyspnea scale to guide intensity level when exercising independently       Knowledge and understanding of Target Heart Rate Range (THRR)  Yes       Intervention  Provide education and explanation  of THRR including how the numbers were predicted and where they are located for reference       Expected Outcomes  Long Term: Able to use THRR to govern intensity when exercising independently;Short Term: Able to state/look up THRR;Short Term: Able to use daily as guideline for intensity in rehab       Able to check pulse independently  Yes       Intervention  Provide education and demonstration on how to check pulse in carotid and radial arteries.;Review the importance of being able to check your own pulse for safety during independent exercise       Expected Outcomes  Short Term: Able to explain why pulse checking is important during independent exercise;Long Term: Able to check pulse independently and accurately       Understanding of Exercise Prescription  Yes       Intervention  Provide education, explanation, and written materials on patient's individual exercise prescription       Expected Outcomes  Short Term: Able to explain program exercise prescription;Long Term: Able to explain home exercise prescription to exercise independently          Nutrition & Weight - Outcomes: Pre Biometrics - 12/05/17 1434      Pre Biometrics   Height  _0  (1.676 m)    Weight  198 lb 8 oz (90 kg)    Waist Circumference  42  inches    Hip Circumference  42 inches    Waist to Hip Ratio  1 %    BMI (Calculated)  32.05    Single Leg Stand  20.66 seconds      Post Biometrics - 02/12/18 0853       Post  Biometrics   Height  _1  (1.676 m)    Weight  201 lb (91.2 kg)    Waist Circumference  41 inches    Hip Circumference  40 inches    Waist to Hip Ratio  1.02 %    BMI (Calculated)  32.46    Single Leg Stand  30 seconds       Nutrition: Nutrition Therapy & Goals - 01/08/18 0858      Nutrition Therapy   Diet  TLC    Drug/Food Interactions  Statins/Certain Fruits    Protein (specify units)  140g    Fiber  35 grams    Saturated Fats  15 max. grams    Fruits and Vegetables  5 servings/day    Sodium  1500 grams      Personal Nutrition Goals   Nutrition Goal  Swap your snacks. Try fruit, 1-2 portions of nuts, hummus and veggies, etc. in place of candy    Personal Goal #2  Increase fiber intake to help you feel more full and satisifed. Choose larger portions of vegetables and proteins than starches    Personal Goal #3  Control portions based on the portion sizes discussed today    Comments  has DM type II but does not follow a diabetic diet, has tried to eat less sweets and baked goods however      Intervention Plan   Intervention  Prescribe, educate and counsel regarding individualized specific dietary modifications aiming towards targeted core components such as weight, hypertension, lipid management, diabetes, heart failure and other comorbidities.    Expected Outcomes  Short Term Goal: Understand basic principles of dietary content, such as calories, fat, sodium, cholesterol and nutrients.;Short Term Goal: A plan has been developed with personal nutrition  goals set during dietitian appointment.;Long Term Goal: Adherence to prescribed nutrition plan.       Nutrition Discharge: Nutrition Assessments - 02/12/18 0854      MEDFICTS Scores   Pre Score  24    Post Score  18    Score Difference  -6        Education Questionnaire Score: Knowledge Questionnaire Score - 02/12/18 0853      Knowledge Questionnaire Score   Pre Score  23/28    Post Score  21/28       Goals reviewed with patient; copy given to patient.

## 2018-06-25 ENCOUNTER — Emergency Department
Admission: EM | Admit: 2018-06-25 | Discharge: 2018-06-26 | Disposition: A | Discharge status: Home / Self Care | Payer: Medicare Other | Attending: Emergency Medicine | Admitting: Emergency Medicine

## 2018-06-25 ENCOUNTER — Other Ambulatory Visit: Payer: Self-pay

## 2018-06-25 DIAGNOSIS — Z7902 Long term (current) use of antithrombotics/antiplatelets: Secondary | ICD-10-CM | POA: Insufficient documentation

## 2018-06-25 DIAGNOSIS — M7918 Myalgia, other site: Secondary | ICD-10-CM | POA: Diagnosis present

## 2018-06-25 DIAGNOSIS — Z951 Presence of aortocoronary bypass graft: Secondary | ICD-10-CM | POA: Insufficient documentation

## 2018-06-25 DIAGNOSIS — Z87891 Personal history of nicotine dependence: Secondary | ICD-10-CM | POA: Diagnosis not present

## 2018-06-25 DIAGNOSIS — W010XXA Fall on same level from slipping, tripping and stumbling without subsequent striking against object, initial encounter: Secondary | ICD-10-CM | POA: Insufficient documentation

## 2018-06-25 DIAGNOSIS — I10 Essential (primary) hypertension: Secondary | ICD-10-CM | POA: Insufficient documentation

## 2018-06-25 DIAGNOSIS — Z7984 Long term (current) use of oral hypoglycemic drugs: Secondary | ICD-10-CM | POA: Insufficient documentation

## 2018-06-25 DIAGNOSIS — Z7982 Long term (current) use of aspirin: Secondary | ICD-10-CM | POA: Diagnosis not present

## 2018-06-25 DIAGNOSIS — I252 Old myocardial infarction: Secondary | ICD-10-CM | POA: Insufficient documentation

## 2018-06-25 DIAGNOSIS — R51 Headache: Secondary | ICD-10-CM | POA: Diagnosis not present

## 2018-06-25 DIAGNOSIS — M542 Cervicalgia: Secondary | ICD-10-CM | POA: Diagnosis not present

## 2018-06-25 DIAGNOSIS — W19XXXA Unspecified fall, initial encounter: Secondary | ICD-10-CM

## 2018-06-25 DIAGNOSIS — Y9289 Other specified places as the place of occurrence of the external cause: Secondary | ICD-10-CM | POA: Diagnosis not present

## 2018-06-25 DIAGNOSIS — Y998 Other external cause status: Secondary | ICD-10-CM | POA: Insufficient documentation

## 2018-06-25 DIAGNOSIS — I2510 Atherosclerotic heart disease of native coronary artery without angina pectoris: Secondary | ICD-10-CM | POA: Insufficient documentation

## 2018-06-25 DIAGNOSIS — Y9354 Activity, bowling: Secondary | ICD-10-CM | POA: Insufficient documentation

## 2018-06-25 DIAGNOSIS — Z79899 Other long term (current) drug therapy: Secondary | ICD-10-CM | POA: Insufficient documentation

## 2018-06-25 DIAGNOSIS — E119 Type 2 diabetes mellitus without complications: Secondary | ICD-10-CM | POA: Insufficient documentation

## 2018-06-25 DIAGNOSIS — M545 Low back pain: Secondary | ICD-10-CM | POA: Insufficient documentation

## 2018-06-25 NOTE — ED Triage Notes (Signed)
Pt arrives to ED via POV from home with c/o lower back pain s/p fall PTA. Pt states he was "bowling when his feet came out from under him and he went airborne and landed on his back". No head injury or LOC.

## 2018-06-26 ENCOUNTER — Emergency Department: Payer: Medicare Other

## 2018-06-26 DIAGNOSIS — M7918 Myalgia, other site: Secondary | ICD-10-CM | POA: Diagnosis not present

## 2018-06-26 MED ORDER — DOCUSATE SODIUM 100 MG PO CAPS: ORAL_CAPSULE | ORAL | 0 refills | Status: DC

## 2018-06-26 MED ORDER — TRAMADOL HCL 50 MG PO TABS
100.0000 mg | ORAL_TABLET | Freq: Once | ORAL | Status: AC
  Administered 2018-06-26: 100 mg via ORAL
  Filled 2018-06-26: qty 2

## 2018-06-26 MED ORDER — HYDROCODONE-ACETAMINOPHEN 5-325 MG PO TABS: 1.0000 | ORAL_TABLET | ORAL | 0 refills | Status: DC | PRN

## 2018-06-26 NOTE — ED Provider Notes (Signed)
[facility] Emergency Department Provider Note  ____________________________________________   First MD Initiated Contact with Patient 06/26/18 0004     (approximate)  I have reviewed the triage vital signs and the nursing notes.   HISTORY  Chief Complaint Fall    HPI Peter Morales is a 67 y.o. male with medical history as listed below which includes anticoagulation on Plavix who presents by private vehicle for evaluation after a fall.  He was bowling tonight when his feet slipped out from under him and he went straight up into the air and landed on his back.  He believes that he may have struck his head on the floor but did not lose consciousness.  He is having some posterior headache and some neck pain when he moves his head side to side and up and down, as well as having moderate pain in the middle of his back and his lower back.  It occasionally radiates around to the front.  He is ambulatory but it is painful for him to move around.  He is also having some pain in bilateral ribs, mostly in the back but occasionally rating to the front.  He was in his usual state of health prior to the fall.  He denies confusion, difficulty with speech, numbness or weakness in his extremities, difficulty breathing, abdominal pain, and dysuria.  Most of the pain is on both sides of his middle and lower back, not in the midline.  He describes the pain as aching as well as sharp.  He reports a history of back problems in the lumbar spine for which she had a spinal decompression years ago.  Past Medical History:  Diagnosis Date  . Anxiety   . Coronary artery disease   . Diabetes mellitus without complication ([place])   . Erectile dysfunction   . GERD (gastroesophageal reflux disease)   . Glaucoma   . Hyperlipidemia   . Hypertension   . Insomnia   . Myocardial infarct [facility])     Patient Active Problem List   Diagnosis Date Noted  . NSTEMI (non-ST elevated myocardial  infarction) ([place]) 11/06/2017  . Unstable angina ([place]) 11/06/2017  . Allergic rhinitis 09/09/2017  . Erectile disorder due to medical condition in male patient 04/27/2016  . Mixed dyslipidemia 02/01/2016  . Controlled type 2 diabetes mellitus with circulatory disorder ([place]) 01/25/2016  . Coronary artery disease involving native coronary artery of native heart with angina pectoris ([place]) 01/25/2016  . Gastroesophageal reflux disease with esophagitis 01/25/2016  . Hypertension, well controlled 01/25/2016  . Insomnia, persistent 01/25/2016    Past Surgical History:  Procedure Laterality Date  . CARDIAC CATHETERIZATION    . CARDIAC SURGERY    . COLONOSCOPY WITH PROPOFOL N/A 01/11/2017   Procedure: COLONOSCOPY WITH PROPOFOL;  Surgeon: Lollie Sails, MD;  Location: [facility] ENDOSCOPY;  Service: Endoscopy;  Laterality: N/A;  . CORONARY ARTERY BYPASS GRAFT      Prior to Admission medications   Medication Sig Start Date End Date Taking? Authorizing Provider  albuterol (PROVENTIL HFA;VENTOLIN HFA) 108 (90 Base) MCG/ACT inhaler Inhale 2 puffs into the lungs every 6 (six) hours as needed for wheezing or shortness of breath.    [provider]  aspirin EC 81 MG tablet Take 81 mg by mouth daily.    [provider]  atorvastatin (LIPITOR) 80 MG tablet Take 80 mg by mouth daily.    [provider]  azelastine (ASTELIN) 0.1 % nasal spray Place 1 spray into both  nostrils 2 (two) times daily. Use in each nostril as directed    [provider]  azelastine (OPTIVAR) 0.05 % ophthalmic solution Place into both eyes 2 (two) times daily.    [provider]  carvedilol (COREG) 12.5 MG tablet Take 12.5 mg by mouth 2 (two) times daily with a meal.    [provider]  cetirizine (ZYRTEC) 10 MG tablet Take 10 mg by mouth daily.    [provider]  clopidogrel (PLAVIX) 75 MG tablet Take 75 mg by mouth daily.    [provider]  cyclobenzaprine  (FLEXERIL) 5 MG tablet Take 5 mg by mouth 3 (three) times daily as needed for muscle spasms.    [provider]  Diphenhydramine-APAP 25-500 MG TABS Take 500 mg by mouth every evening.    [provider]  docusate sodium (COLACE) 100 MG capsule Take 1 tablet once or twice daily as needed for constipation while taking narcotic pain medicine 06/26/18   Hinda Kehr, MD  fenofibrate (TRICOR) 145 MG tablet Take 145 mg by mouth daily.    [provider]  fexofenadine (ALLEGRA) 180 MG tablet Take by mouth.    [provider]  fluticasone (FLONASE) 50 MCG/ACT nasal spray Place into the nose.    [provider]  glipiZIDE (GLUCOTROL) 10 MG tablet Take 10 mg by mouth daily before breakfast.    [provider]  HYDROcodone-acetaminophen (NORCO/VICODIN) 5-325 MG tablet Take 1-2 tablets by mouth every 4 (four) hours as needed for moderate pain. 06/26/18   Hinda Kehr, MD  isosorbide mononitrate (IMDUR) 30 MG 24 hr tablet Take by mouth. 03/18/17 03/18/18  [provider]  ketotifen (ZADITOR) 0.025 % ophthalmic solution Apply to eye. 09/09/17   [provider]  lisinopril (PRINIVIL,ZESTRIL) 10 MG tablet Take 10 mg by mouth daily.    [provider]  LORazepam (ATIVAN) 0.5 MG tablet Take 0.5 mg by mouth every 8 (eight) hours. takr 1/2 tab as needed    [provider]  metFORMIN (GLUMETZA) 1000 MG (MOD) 24 hr tablet Take 1,000 mg by mouth 2 (two) times daily with a meal.    [provider]  montelukast (SINGULAIR) 10 MG tablet Take by mouth. 09/09/17 09/09/18  [provider]  nitroGLYCERIN (NITROSTAT) 0.4 MG SL tablet Place 0.4 mg under the tongue every 5 (five) minutes as needed for chest pain.    [provider]  pantoprazole (PROTONIX) 40 MG tablet Take 40 mg by mouth daily.    [provider]  Propylhexedrine Darcella Gasman) INHA Place into the nose.    [provider]  rosuvastatin  (CRESTOR) 40 MG tablet Take by mouth. 11/13/17 11/13/18  [provider]  ticagrelor (BRILINTA) 90 MG TABS tablet Take by mouth. 11/08/17   [provider]    Allergies Patient has no known allergies.  No family history on file.  Social History Social History   Tobacco Use  . Smoking status: Former Smoker    Last attempt to quit: 07/23/1983    Years since quitting: 34.9  . Smokeless tobacco: Never Used  Substance Use Topics  . Alcohol use: No    Comment: occasional   . Drug use: No    Review of Systems Constitutional: No fever/chills Eyes: No visual changes. Cardiovascular: Denies chest pain except for some lateral and posterior rib pain. Respiratory: Denies shortness of breath. Gastrointestinal: No abdominal pain.  No nausea, no vomiting.  No diarrhea.  No constipation. Genitourinary: Negative for dysuria.  Musculoskeletal: Pain in the middle and lower part of his back as well as in bilateral ribs, neck, and the back of the head. Integumentary: Negative for rash. Neurological: Negative for headaches, focal weakness or numbness.   ____________________________________________   PHYSICAL EXAM:  VITAL SIGNS: ED Triage Vitals  Enc Vitals Group     BP 06/25/18 2224 (!) 160/67     Pulse Rate 06/25/18 2224 90     Resp 06/25/18 2224 17     Temp 06/25/18 2224 98.4 F (36.9 C)     Temp Source 06/25/18 2224 Oral     SpO2 06/25/18 2224 96 %     Weight 06/25/18 2222 91.2 kg (201 lb)     Height 06/25/18 2222 1.676 m (5\' 6" )     Head Circumference --      Peak Flow --      Pain Score 06/25/18 2222 9     Pain Loc --      Pain Edu? --      Excl. in [place]? --     Constitutional: Alert and oriented. Well appearing and in no acute distress although he does appear uncomfortable when he moves around. Eyes: Conjunctivae are normal.  Head: Atraumatic. Nose: No congestion/rhinnorhea. Mouth/Throat: Mucous membranes are moist. Neck: No stridor.  No meningeal signs.   No cervical spine tenderness to palpation, but he does have some pain when he flexes and extends as well as rotates his head. Cardiovascular: Normal rate, regular rhythm. Good peripheral circulation. Grossly normal heart sounds. Respiratory: Normal respiratory effort.  No retractions. Lungs CTAB. Gastrointestinal: Soft and nontender. No distention.  Musculoskeletal: No midline tenderness to palpation throughout the length of the spine and no step-offs or deformities.  The patient does have reproducible tenderness to palpation on either side of his spine for most of the thoracic and lumbar regions but without any specific point tenderness.  He also has some tenderness to palpation of bilateral lateral ribs mostly on the posterior aspect. Neurologic:  Normal speech and language. No gross focal neurologic deficits are appreciated.  Skin:  Skin is warm, dry and intact. No rash noted. Psychiatric: Mood and affect are normal. Speech and behavior are normal.  ____________________________________________   LABS (all labs ordered are listed, but only abnormal results are displayed)  Labs Reviewed - No data to display ____________________________________________  EKG  None - EKG not ordered by ED physician ____________________________________________  RADIOLOGY I, Hinda Kehr, personally viewed and evaluated these images (plain radiographs) as part of my medical decision making, as well as reviewing the written report by the radiologist.  ED MD interpretation: No acute abnormalities on radiographs nor CT head/cervical spine  Official radiology report(s): Dg Chest 2 View  Result Date: 06/26/2018 CLINICAL DATA:  Bilateral rib pain after fall. Slipped while bowling landing on back. EXAM: CHEST - 2 VIEW COMPARISON:  Chest radiograph 10/31/2015 FINDINGS: Post median sternotomy. Unchanged heart size and mediastinal contours. No pneumothorax, focal airspace disease, or pleural effusion. No pulmonary  edema. No visualized rib fracture. IMPRESSION: No acute abnormality. Post CABG. Electronically Signed   By: Jeb Levering M.D.   On: 06/26/2018 01:17   Dg Thoracic Spine 2 View  Result Date: 06/26/2018 CLINICAL DATA:  Initial evaluation for acute back pain status post fall. EXAM: THORACIC SPINE 2 VIEWS COMPARISON:  None. FINDINGS: Vertebral bodies normally aligned with preservation of the normal thoracic kyphosis. Vertebral body heights maintained. No acute fracture or malalignment. Mild scattered endplate spurring present throughout the mid and  lower thoracic spine. Median sternotomy wires noted. No acute soft tissue abnormality. IMPRESSION: No radiographic evidence for acute abnormality within the thoracic spine. Electronically Signed   By: Jeannine Boga M.D.   On: 06/26/2018 01:14   Dg Lumbar Spine 2-3 Views  Result Date: 06/26/2018 CLINICAL DATA:  Lumbosacral back pain after fall. Slipped while bowling landing on back. EXAM: LUMBAR SPINE - 2-3 VIEW COMPARISON:  None. FINDINGS: The alignment is maintained. Vertebral body heights are normal. There is no listhesis. The posterior elements are intact. No fracture. Disc space narrowing and endplate spurring at O3-J0 and L5-S1. Mild facet arthropathy at these levels. Sacroiliac joints are congruent. Aortic atherosclerosis. 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 Electronically Signed   By: Jeb Levering M.D.   On: 06/26/2018 01:16   Ct Head Wo Contrast  Result Date: 06/26/2018 CLINICAL DATA:  Low back pain, status post fall, bowling injury EXAM: CT HEAD WITHOUT CONTRAST CT CERVICAL SPINE WITHOUT CONTRAST TECHNIQUE: Multidetector CT imaging of the head and cervical spine was performed following the standard protocol without intravenous contrast. Multiplanar CT image reconstructions of the cervical spine were also generated. COMPARISON:  None. FINDINGS: CT HEAD FINDINGS Brain: No evidence of acute infarction,  hemorrhage, hydrocephalus, extra-axial collection or mass lesion/mass effect. Vascular: Mild intracranial atherosclerosis. Skull: Normal. Negative for fracture or focal lesion. Sinuses/Orbits: The visualized paranasal sinuses are essentially clear. The mastoid air cells are unopacified. Other: None. CT CERVICAL SPINE FINDINGS Alignment: Normal cervical lordosis. Skull base and vertebrae: No acute fracture. No primary bone lesion or focal pathologic process. Soft tissues and spinal canal: No prevertebral fluid or swelling. No visible canal hematoma. Disc levels: Mild degenerative changes of the lower cervical spine. Spinal canal is patent. Upper chest: Visualized lung apices are clear. Other: Visualized thyroid is unremarkable. IMPRESSION: Normal head CT. No evidence of traumatic injury to the cervical spine. Mild degenerative changes. Electronically Signed   By: Julian Hy M.D.   On: 06/26/2018 01:19   Ct Cervical Spine Wo Contrast  Result Date: 06/26/2018 CLINICAL DATA:  Low back pain, status post fall, bowling injury EXAM: CT HEAD WITHOUT CONTRAST CT CERVICAL SPINE WITHOUT CONTRAST TECHNIQUE: Multidetector CT imaging of the head and cervical spine was performed following the standard protocol without intravenous contrast. Multiplanar CT image reconstructions of the cervical spine were also generated. COMPARISON:  None. FINDINGS: CT HEAD FINDINGS Brain: No evidence of acute infarction, hemorrhage, hydrocephalus, extra-axial collection or mass lesion/mass effect. Vascular: Mild intracranial atherosclerosis. Skull: Normal. Negative for fracture or focal lesion. Sinuses/Orbits: The visualized paranasal sinuses are essentially clear. The mastoid air cells are unopacified. Other: None. CT CERVICAL SPINE FINDINGS Alignment: Normal cervical lordosis. Skull base and vertebrae: No acute fracture. No primary bone lesion or focal pathologic process. Soft tissues and spinal canal: No prevertebral fluid or swelling.  No visible canal hematoma. Disc levels: Mild degenerative changes of the lower cervical spine. Spinal canal is patent. Upper chest: Visualized lung apices are clear. Other: Visualized thyroid is unremarkable. IMPRESSION: Normal head CT. No evidence of traumatic injury to the cervical spine. Mild degenerative changes. Electronically Signed   By: Julian Hy M.D.   On: 06/26/2018 01:19    ____________________________________________   PROCEDURES  Critical Care performed: No   Procedure(s) performed:   Procedures   ____________________________________________   INITIAL IMPRESSION / ASSESSMENT AND PLAN / ED COURSE  As part of my medical decision making, I reviewed the following data within the [place]  Nursing notes reviewed and incorporated and Radiograph reviewed     The patient is sore with musculoskeletal pain from his fall, but there is no evidence of any acute or emergent injury.  Based on the fact that he takes Plavix and the nature of his injury I obtain a CT scan of his head and cervical spine and fortunately has no evidence of acute intracranial injury or cervical spine abnormality.  He has been stable for several hours of observation in the ED.  He had some pain relief with tramadol.  I have updated the patient and daughter about the reassuring radiograph and CT results and they are comfortable with the plan for discharge and outpatient follow-up.  I gave my usual customary return precautions.  I attempted to check the [place] controlled substance database but the system is down.  I am providing him with a short course of Norco and encourage the use of over-the-counter medication whenever possible.  I gave my usual and customary return precautions.    ____________________________________________  FINAL CLINICAL IMPRESSION(S) / ED DIAGNOSES  Final diagnoses:  Fall, initial encounter  Musculoskeletal pain     MEDICATIONS GIVEN DURING THIS  VISIT:  Medications  traMADol (ULTRAM) tablet 100 mg (100 mg Oral Given 06/26/18 0107)     ED Discharge Orders        Ordered    HYDROcodone-acetaminophen (NORCO/VICODIN) 5-325 MG tablet  Every 4 hours PRN     06/26/18 0243    docusate sodium (COLACE) 100 MG capsule     06/26/18 0243       Note:  This document was prepared using Dragon voice recognition software and may include unintentional dictation errors.    Hinda Kehr, MD 06/26/18 [phone]

## 2018-06-26 NOTE — Discharge Instructions (Addendum)
You have been seen in the Emergency Department (ED) today for a fall.  Your work up does not show any concerning injuries.  Please take over-the-counter ibuprofen and/or Tylenol as needed for your pain (unless you have an allergy or your doctor as told you not to take them), or take any prescribed medication as instructed.  Take Norco as prescribed for severe pain. Do not drink alcohol, drive or participate in any other potentially dangerous activities while taking this medication as it may make you sleepy. Do not take this medication with any other sedating medications, either prescription or over-the-counter. If you were prescribed Percocet or Vicodin, do not take these with acetaminophen (Tylenol) as it is already contained within these medications.   This medication is an opiate (or narcotic) pain medication and can be habit forming.  Use it as little as possible to achieve adequate pain control.  Do not use or use it with extreme caution if you have a history of opiate abuse or dependence.  If you are on a pain contract with your primary care doctor or a pain specialist, be sure to let them know you were prescribed this medication today from the [facility] Emergency Department.  This medication is intended for your use only - do not give any to anyone else and keep it in a secure place where nobody else, especially children, have access to it.  It will also cause or worsen constipation, so you may want to consider taking an over-the-counter stool softener while you are taking this medication.  Please follow up with your doctor regarding today's Emergency Department (ED) visit and your recent fall.    Return to the ED if you have any headache, confusion, slurred speech, weakness/numbness of any arm or leg, or any increased pain.

## 2018-06-26 NOTE — ED Notes (Signed)
Dr. Forbach at the bedside for pt evaluation  

## 2018-10-17 ENCOUNTER — Ambulatory Visit
Admission: EM | Admit: 2018-10-17 | Discharge: 2018-10-17 | Disposition: A | Discharge status: Home / Self Care | Payer: Medicare Other | Attending: Family Medicine | Admitting: Family Medicine

## 2018-10-17 ENCOUNTER — Encounter: Payer: Self-pay | Admitting: Emergency Medicine

## 2018-10-17 ENCOUNTER — Other Ambulatory Visit: Payer: Self-pay

## 2018-10-17 ENCOUNTER — Telehealth: Payer: Self-pay | Admitting: Emergency Medicine

## 2018-10-17 DIAGNOSIS — J40 Bronchitis, not specified as acute or chronic: Secondary | ICD-10-CM

## 2018-10-17 DIAGNOSIS — B9789 Other viral agents as the cause of diseases classified elsewhere: Secondary | ICD-10-CM

## 2018-10-17 DIAGNOSIS — J069 Acute upper respiratory infection, unspecified: Secondary | ICD-10-CM

## 2018-10-17 HISTORY — DX: Allergic rhinitis, unspecified: J30.9

## 2018-10-17 HISTORY — DX: Unspecified asthma, uncomplicated: J45.909

## 2018-10-17 MED ORDER — PREDNISONE 20 MG PO TABS: 40.0000 mg | ORAL_TABLET | Freq: Every day | ORAL | 0 refills | Status: DC

## 2018-10-17 MED ORDER — DOXYCYCLINE HYCLATE 100 MG PO CAPS: 100.0000 mg | ORAL_CAPSULE | Freq: Two times a day (BID) | ORAL | 0 refills | Status: DC

## 2018-10-17 MED ORDER — HYDROCOD POLST-CPM POLST ER 10-8 MG/5ML PO SUER: 5.0000 mL | Freq: Every evening | ORAL | 0 refills | Status: DC | PRN

## 2018-10-17 NOTE — Discharge Instructions (Signed)
Take medication as prescribed. Rest. Drink plenty of fluids. Continue inhaler use as needed.  Follow up with your primary care physician this week as needed. Return to Urgent care for new or worsening concerns.

## 2018-10-17 NOTE — Telephone Encounter (Signed)
Change of pharmacy

## 2018-10-17 NOTE — ED Provider Notes (Signed)
[facility] ____________________________________________  Time seen: Approximately 10:17 AM  I have reviewed the triage vital signs and the nursing notes.   HISTORY  Chief Complaint Cough (APPOINTMENT)   HPI Peter Morales is a 67 y.o. male presenting for evaluation of 1 week of cough and congestion symptoms.  Reports nasal congestion continuing.  Cough is his biggest complaint.  States cough is much more tolerable during the day but worsens at night with some postnasal drainage.  States cough disrupt his and his wife's sleep.  States does intermittently hear some wheezing when he coughs.  Does report history of asthma.  Has intermittently used albuterol inhaler which helps some.  States he is sore on his sides in his back from coughing so much.  States only gets a little mucus out, cough is mostly dry.  Does report he had a fever of 101 on Monday, and some chills on Sunday but denies any other fevers.  Has tried multiple over-the-counter cough and congestion agents without resolution.  Continues to overall eat and drink well.  Denies chest pain or shortness of breath.  Denies other aggravating alleviating factors.  Denies recent sickness otherwise.  Barbaraann Boys, MD: PCP   Past Medical History:  Diagnosis Date  . Allergic rhinitis   . Anxiety   . Asthma   . Coronary artery disease   . Diabetes mellitus without complication ([place])   . Erectile dysfunction   . GERD (gastroesophageal reflux disease)   . Glaucoma   . Hyperlipidemia   . Hypertension   . Insomnia   . Myocardial infarct [facility])     Patient Active Problem List   Diagnosis Date Noted  . NSTEMI (non-ST elevated myocardial infarction) ([place]) 11/06/2017  . Unstable angina ([place]) 11/06/2017  . Allergic rhinitis 09/09/2017  . Erectile disorder due to medical condition in male patient 04/27/2016  . Mixed dyslipidemia 02/01/2016  . Controlled type 2 diabetes mellitus with circulatory disorder ([place]) 01/25/2016    . Coronary artery disease involving native coronary artery of native heart with angina pectoris ([place]) 01/25/2016  . Gastroesophageal reflux disease with esophagitis 01/25/2016  . Hypertension, well controlled 01/25/2016  . Insomnia, persistent 01/25/2016    Past Surgical History:  Procedure Laterality Date  . CARDIAC CATHETERIZATION    . CARDIAC SURGERY    . COLONOSCOPY WITH PROPOFOL N/A 01/11/2017   Procedure: COLONOSCOPY WITH PROPOFOL;  Surgeon: Lollie Sails, MD;  Location: [facility] ENDOSCOPY;  Service: Endoscopy;  Laterality: N/A;  . CORONARY ARTERY BYPASS GRAFT       No current facility-administered medications for this encounter.   Current Outpatient Medications:  .  albuterol (PROVENTIL HFA;VENTOLIN HFA) 108 (90 Base) MCG/ACT inhaler, Inhale 2 puffs into the lungs every 6 (six) hours as needed for wheezing or shortness of breath., Disp: , Rfl:  .  aspirin EC 81 MG tablet, Take 81 mg by mouth daily., Disp: , Rfl:  .  carvedilol (COREG) 12.5 MG tablet, Take 12.5 mg by mouth 2 (two) times daily with a meal., Disp: , Rfl:  .  cetirizine (ZYRTEC) 10 MG tablet, Take 10 mg by mouth daily., Disp: , Rfl:  .  clopidogrel (PLAVIX) 75 MG tablet, Take 75 mg by mouth daily., Disp: , Rfl:  .  glipiZIDE (GLUCOTROL) 10 MG tablet, Take 10 mg by mouth daily before breakfast., Disp: , Rfl:  .  HYDROcodone-acetaminophen (NORCO/VICODIN) 5-325 MG tablet, Take 1-2 tablets by mouth every 4 (four) hours as needed for moderate pain., Disp: 15 tablet, Rfl:  0 .  isosorbide mononitrate (IMDUR) 30 MG 24 hr tablet, Take by mouth., Disp: , Rfl:  .  ketotifen (ZADITOR) 0.025 % ophthalmic solution, Apply to eye., Disp: , Rfl:  .  lisinopril (PRINIVIL,ZESTRIL) 10 MG tablet, Take 10 mg by mouth daily., Disp: , Rfl:  .  LORazepam (ATIVAN) 0.5 MG tablet, Take 0.5 mg by mouth every 8 (eight) hours. takr 1/2 tab as needed, Disp: , Rfl:  .  metFORMIN (GLUMETZA) 1000 MG (MOD) 24 hr tablet, Take 1,000 mg by mouth 2 (two)  times daily with a meal., Disp: , Rfl:  .  nitroGLYCERIN (NITROSTAT) 0.4 MG SL tablet, Place 0.4 mg under the tongue every 5 (five) minutes as needed for chest pain., Disp: , Rfl:  .  pantoprazole (PROTONIX) 40 MG tablet, Take 40 mg by mouth daily., Disp: , Rfl:  .  rosuvastatin (CRESTOR) 40 MG tablet, Take by mouth., Disp: , Rfl:  .  tiZANidine (ZANAFLEX) 2 MG tablet, Take 2 mg by mouth every 8 (eight) hours as needed for muscle spasms., Disp: , Rfl:  .  azelastine (ASTELIN) 0.1 % nasal spray, Place 1 spray into both nostrils 2 (two) times daily. Use in each nostril as directed, Disp: , Rfl:  .  azelastine (OPTIVAR) 0.05 % ophthalmic solution, Place into both eyes 2 (two) times daily., Disp: , Rfl:  .  chlorpheniramine-HYDROcodone (TUSSIONEX PENNKINETIC ER) 10-8 MG/5ML SUER, Take 5 mLs by mouth at bedtime as needed for cough. do not drive or operate machinery while taking as can cause drowsiness., Disp: 50 mL, Rfl: 0 .  Diphenhydramine-APAP 25-500 MG TABS, Take 500 mg by mouth every evening., Disp: , Rfl:  .  docusate sodium (COLACE) 100 MG capsule, Take 1 tablet once or twice daily as needed for constipation while taking narcotic pain medicine, Disp: 30 capsule, Rfl: 0 .  doxycycline (VIBRAMYCIN) 100 MG capsule, Take 1 capsule (100 mg total) by mouth 2 (two) times daily., Disp: 20 capsule, Rfl: 0 .  fexofenadine (ALLEGRA) 180 MG tablet, Take by mouth., Disp: , Rfl:  .  montelukast (SINGULAIR) 10 MG tablet, Take by mouth., Disp: , Rfl:  .  predniSONE (DELTASONE) 20 MG tablet, Take 2 tablets (40 mg total) by mouth daily., Disp: 10 tablet, Rfl: 0 .  Propylhexedrine (BENZEDREX) INHA, Place into the nose., Disp: , Rfl:  .  ticagrelor (BRILINTA) 90 MG TABS tablet, Take by mouth., Disp: , Rfl:   Allergies Patient has no known allergies.  History reviewed. No pertinent family history.  Social History Social History   Tobacco Use  . Smoking status: Former Smoker    Last attempt to quit: 07/23/1983     Years since quitting: 35.2  . Smokeless tobacco: Never Used  Substance Use Topics  . Alcohol use: No    Comment: occasional   . Drug use: No    Review of Systems Constitutional: Fever as above.  ENT: initially had sore throat, minimal sore throat now.  Cardiovascular: Denies chest pain. Respiratory: Denies shortness of breath. Gastrointestinal: No abdominal pain.  Musculoskeletal: as above.  Skin: Negative for rash.   ____________________________________________   PHYSICAL EXAM:  VITAL SIGNS: ED Triage Vitals  Enc Vitals Group     BP 10/17/18 0928 127/90     Pulse Rate 10/17/18 0928 83     Resp 10/17/18 0928 16     Temp 10/17/18 0928 98.2 F (36.8 C)     Temp Source 10/17/18 0928 Oral     SpO2 10/17/18 0928  98 %     Weight 10/17/18 0919 200 lb (90.7 kg)     Height 10/17/18 0919 5\' 6"  (1.676 m)     Head Circumference --      Peak Flow --      Pain Score 10/17/18 0919 7     Pain Loc --      Pain Edu? --      Excl. in [place]? --     Constitutional: Alert and oriented. Well appearing and in no acute distress. Eyes: Conjunctivae are normal.  Head: Atraumatic. No sinus tenderness to palpation. No swelling. No erythema.  Ears: no erythema, normal TMs bilaterally.   Nose:Nasal congestion   Mouth/Throat: Mucous membranes are moist. No pharyngeal erythema. No tonsillar swelling or exudate.  Neck: No stridor.  No cervical spine tenderness to palpation. Hematological/Lymphatic/Immunilogical: No cervical lymphadenopathy. Cardiovascular: Normal rate, regular rhythm. Grossly normal heart sounds.  Good peripheral circulation. Respiratory: Normal respiratory effort.  No retractions. No wheezes, rales or rhonchi. Good air movement.  Dry cough in room with bronchospasm. Speaks in complete sentences.  Musculoskeletal: Ambulatory with steady gait.  Neurologic:  Normal speech and language. No gait instability. Skin:  Skin appears warm, dry and intact. No rash noted. Psychiatric:  Mood and affect are normal. Speech and behavior are normal.  ___________________________________________   LABS (all labs ordered are listed, but only abnormal results are displayed)  Labs Reviewed - No data to display  PROCEDURES Procedures   INITIAL IMPRESSION / ASSESSMENT AND PLAN / ED COURSE  Pertinent labs & imaging results that were available during my care of the patient were reviewed by me and considered in my medical decision making (see chart for details).  Well-appearing patient.  No acute distress.  Suspect recent viral upper respiratory infection with spasm and bronchitis.  Will treat with prednisone, continue albuterol inhaler, doxycycline and PRN Tussionex at night.  Encourage rest, fluids, supportive care. Discussed indication, risks and benefits of medications with patient.  Discussed follow up with Primary care physician this week. Discussed follow up and return parameters including no resolution or any worsening concerns. Patient verbalized understanding and agreed to plan.   ____________________________________________   FINAL CLINICAL IMPRESSION(S) / ED DIAGNOSES  Final diagnoses:  Viral URI with cough  Bronchitis     ED Discharge Orders         Ordered    predniSONE (DELTASONE) 20 MG tablet  Daily     10/17/18 1006    doxycycline (VIBRAMYCIN) 100 MG capsule  2 times daily     10/17/18 1006    chlorpheniramine-HYDROcodone (TUSSIONEX PENNKINETIC ER) 10-8 MG/5ML SUER  At bedtime PRN     10/17/18 1006           Note: This dictation was prepared with Dragon dictation along with smaller phrase technology. Any transcriptional errors that result from this process are unintentional.         Marylene Land, NP 10/17/18 1021

## 2018-10-17 NOTE — ED Triage Notes (Signed)
Patient c/o cough and chest congestion for a week.  Patient also reports some back pain when he coughs.  Patient reports fevers.

## 2019-04-11 IMAGING — CR DG LUMBAR SPINE 2-3V
3 series · 3 of 3 positions shown · non-contrast
Comparison: None.

CLINICAL DATA: Lumbosacral back pain after fall. Slipped while
bowling landing on back.

EXAM:
LUMBAR SPINE - 2-3 VIEW

[l-spine ap]
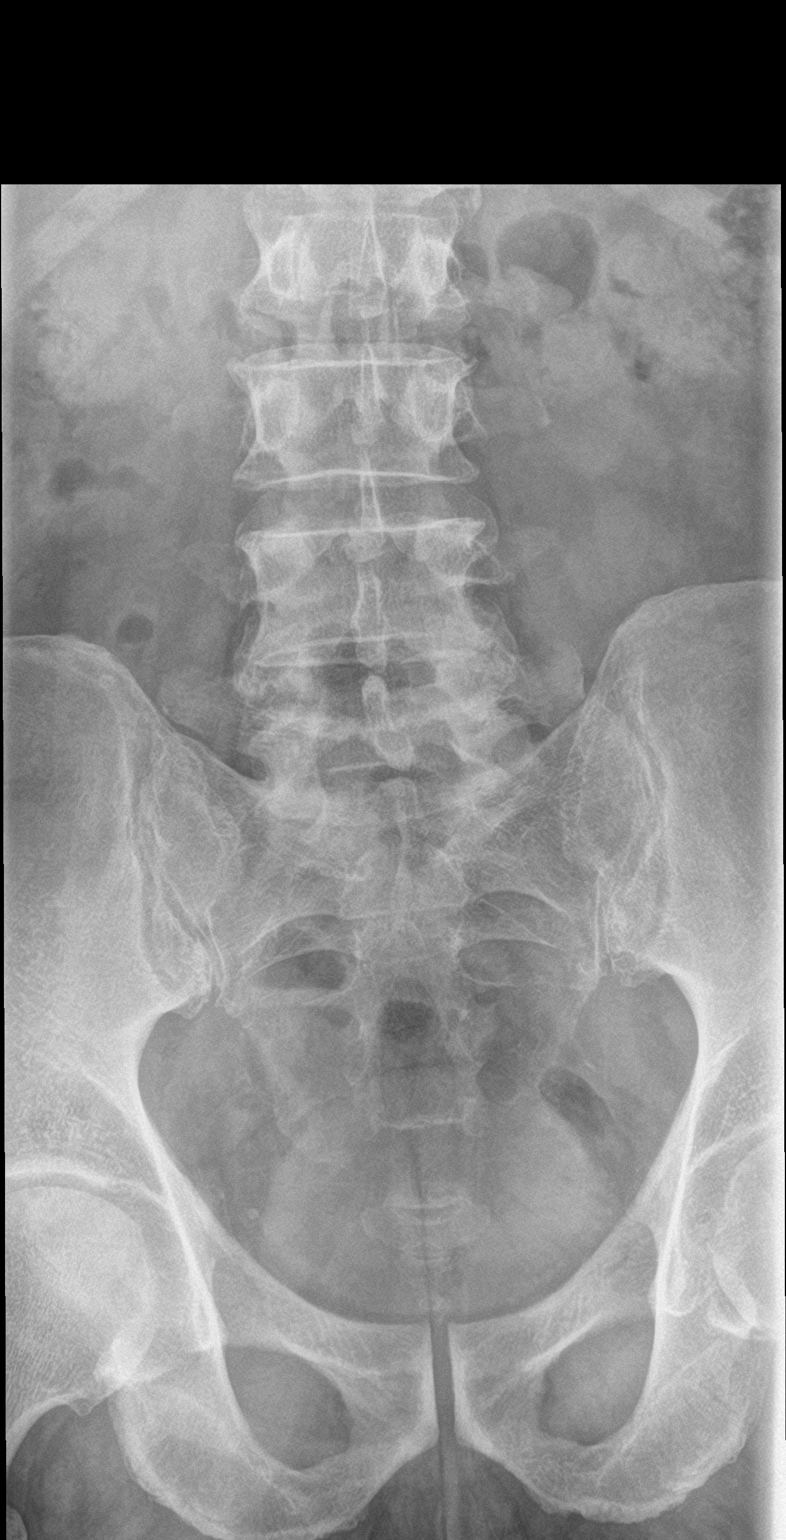

[l-spine lat]
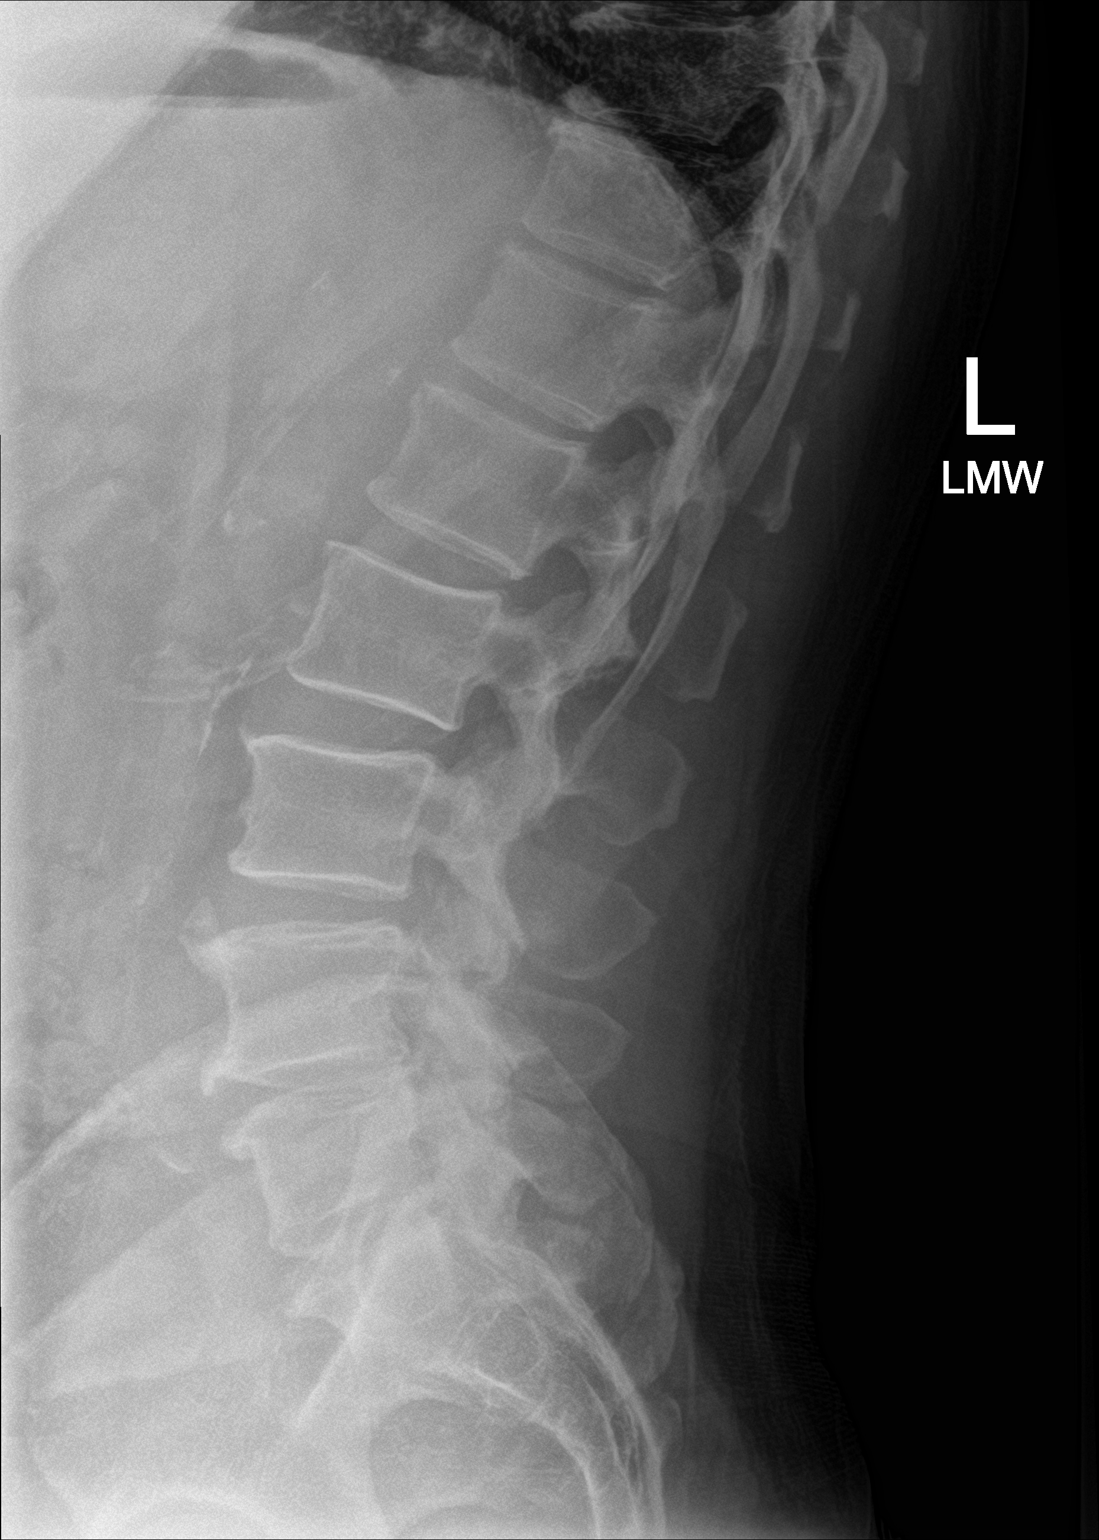

[l-spine spot]
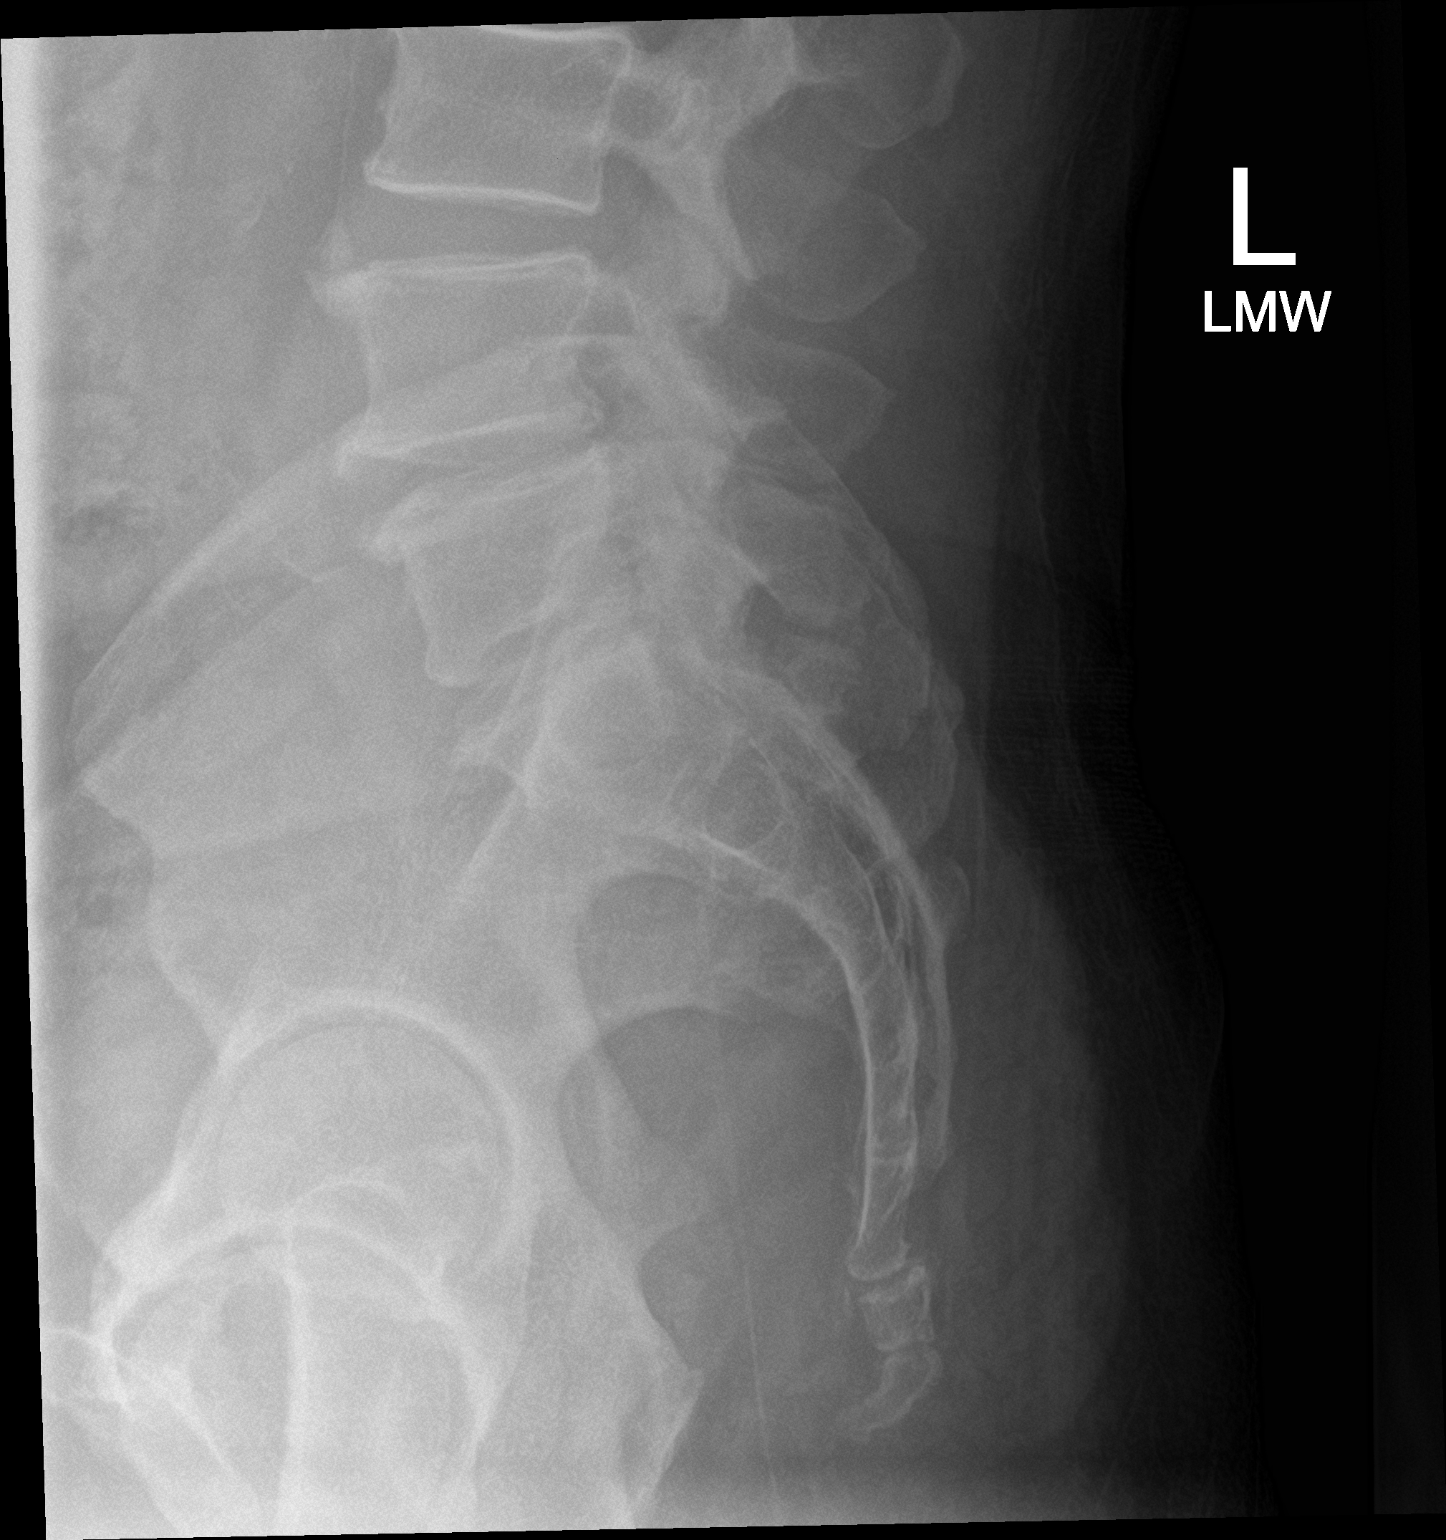

[3 of 3 positions shown; findings below may reference images not displayed]

FINDINGS: The alignment is maintained. Vertebral body heights are normal.
There is no listhesis. The posterior elements are intact. No
fracture. Disc space narrowing and endplate spurring at L4-L5 and
L5-S1. Mild facet arthropathy at these levels. Sacroiliac joints are
congruent. Aortic atherosclerosis.
IMPRESSION: Mild degenerative change in the lumbar spine without acute fracture.

## 2019-04-15 DIAGNOSIS — M2550 Pain in unspecified joint: Secondary | ICD-10-CM | POA: Insufficient documentation

## 2019-04-15 DIAGNOSIS — R200 Anesthesia of skin: Secondary | ICD-10-CM | POA: Insufficient documentation

## 2019-04-15 DIAGNOSIS — R2233 Localized swelling, mass and lump, upper limb, bilateral: Secondary | ICD-10-CM | POA: Insufficient documentation

## 2019-09-15 DIAGNOSIS — M222X2 Patellofemoral disorders, left knee: Secondary | ICD-10-CM | POA: Insufficient documentation

## 2019-09-15 DIAGNOSIS — G8929 Other chronic pain: Secondary | ICD-10-CM | POA: Insufficient documentation

## 2020-01-08 DIAGNOSIS — K635 Polyp of colon: Secondary | ICD-10-CM | POA: Insufficient documentation

## 2020-02-22 DIAGNOSIS — I5032 Chronic diastolic (congestive) heart failure: Secondary | ICD-10-CM | POA: Insufficient documentation

## 2020-05-31 ENCOUNTER — Other Ambulatory Visit: Payer: Self-pay

## 2020-05-31 ENCOUNTER — Other Ambulatory Visit
Admission: RE | Admit: 2020-05-31 | Discharge: 2020-05-31 | Disposition: A | Discharge status: Home / Self Care | Payer: Medicare Other | Source: Ambulatory Visit | Attending: Internal Medicine | Admitting: Internal Medicine

## 2020-05-31 DIAGNOSIS — Z20822 Contact with and (suspected) exposure to covid-19: Secondary | ICD-10-CM | POA: Diagnosis not present

## 2020-05-31 DIAGNOSIS — Z01812 Encounter for preprocedural laboratory examination: Secondary | ICD-10-CM | POA: Insufficient documentation

## 2020-05-31 LAB — SARS CORONAVIRUS 2 (TAT 6-24 HRS): SARS Coronavirus 2: NEGATIVE

## 2020-06-01 ENCOUNTER — Encounter: Payer: Self-pay | Admitting: Internal Medicine

## 2020-06-02 ENCOUNTER — Encounter
Admission: RE | Disposition: A | Discharge status: Home / Self Care | Payer: Self-pay | Source: Home / Self Care | Attending: Internal Medicine

## 2020-06-02 ENCOUNTER — Ambulatory Visit: Payer: Medicare Other | Admitting: Anesthesiology

## 2020-06-02 ENCOUNTER — Encounter: Payer: Self-pay | Admitting: Internal Medicine

## 2020-06-02 ENCOUNTER — Ambulatory Visit
Admission: RE | Admit: 2020-06-02 | Discharge: 2020-06-02 | Disposition: A | Discharge status: Home / Self Care | Payer: Medicare Other | Attending: Internal Medicine | Admitting: Internal Medicine

## 2020-06-02 DIAGNOSIS — Z86010 Personal history of colonic polyps: Secondary | ICD-10-CM | POA: Diagnosis present

## 2020-06-02 DIAGNOSIS — Z7984 Long term (current) use of oral hypoglycemic drugs: Secondary | ICD-10-CM | POA: Diagnosis not present

## 2020-06-02 DIAGNOSIS — I10 Essential (primary) hypertension: Secondary | ICD-10-CM | POA: Diagnosis not present

## 2020-06-02 DIAGNOSIS — Z7982 Long term (current) use of aspirin: Secondary | ICD-10-CM | POA: Diagnosis not present

## 2020-06-02 DIAGNOSIS — E785 Hyperlipidemia, unspecified: Secondary | ICD-10-CM | POA: Diagnosis not present

## 2020-06-02 DIAGNOSIS — E119 Type 2 diabetes mellitus without complications: Secondary | ICD-10-CM | POA: Insufficient documentation

## 2020-06-02 DIAGNOSIS — Z79899 Other long term (current) drug therapy: Secondary | ICD-10-CM | POA: Insufficient documentation

## 2020-06-02 DIAGNOSIS — J45909 Unspecified asthma, uncomplicated: Secondary | ICD-10-CM | POA: Insufficient documentation

## 2020-06-02 DIAGNOSIS — Z1211 Encounter for screening for malignant neoplasm of colon: Secondary | ICD-10-CM | POA: Diagnosis not present

## 2020-06-02 DIAGNOSIS — Z7902 Long term (current) use of antithrombotics/antiplatelets: Secondary | ICD-10-CM | POA: Insufficient documentation

## 2020-06-02 DIAGNOSIS — K635 Polyp of colon: Secondary | ICD-10-CM | POA: Insufficient documentation

## 2020-06-02 DIAGNOSIS — H409 Unspecified glaucoma: Secondary | ICD-10-CM | POA: Insufficient documentation

## 2020-06-02 DIAGNOSIS — I2510 Atherosclerotic heart disease of native coronary artery without angina pectoris: Secondary | ICD-10-CM | POA: Diagnosis not present

## 2020-06-02 DIAGNOSIS — F419 Anxiety disorder, unspecified: Secondary | ICD-10-CM | POA: Insufficient documentation

## 2020-06-02 DIAGNOSIS — K219 Gastro-esophageal reflux disease without esophagitis: Secondary | ICD-10-CM | POA: Insufficient documentation

## 2020-06-02 DIAGNOSIS — I252 Old myocardial infarction: Secondary | ICD-10-CM | POA: Insufficient documentation

## 2020-06-02 HISTORY — DX: Unspecified visual disturbance: H53.9

## 2020-06-02 HISTORY — PX: ESOPHAGOGASTRODUODENOSCOPY (EGD) WITH PROPOFOL: SHX5813

## 2020-06-02 HISTORY — DX: Polyp of colon: K63.5

## 2020-06-02 HISTORY — DX: Benign neoplasm of colon, unspecified: D12.6

## 2020-06-02 HISTORY — PX: COLONOSCOPY WITH PROPOFOL: SHX5780

## 2020-06-02 LAB — GLUCOSE, CAPILLARY: Glucose-Capillary: 180 mg/dL — ABNORMAL HIGH (ref 70–99)

## 2020-06-02 SURGERY — COLONOSCOPY WITH PROPOFOL
Anesthesia: General

## 2020-06-02 MED ORDER — EPHEDRINE SULFATE 50 MG/ML IJ SOLN
INTRAMUSCULAR | Status: DC | PRN
  Administered 2020-06-02 (×2): 5 mg via INTRAVENOUS

## 2020-06-02 MED ORDER — PROPOFOL 500 MG/50ML IV EMUL
INTRAVENOUS | Status: AC
  Filled 2020-06-02: qty 50

## 2020-06-02 MED ORDER — SODIUM CHLORIDE 0.9 % IV SOLN: INTRAVENOUS | Status: DC

## 2020-06-02 MED ORDER — PROPOFOL 500 MG/50ML IV EMUL
INTRAVENOUS | Status: DC | PRN
  Administered 2020-06-02: 150 ug/kg/min via INTRAVENOUS

## 2020-06-02 MED ORDER — BUTAMBEN-TETRACAINE-BENZOCAINE 2-2-14 % EX AERO
INHALATION_SPRAY | CUTANEOUS | Status: AC
  Filled 2020-06-02: qty 5

## 2020-06-02 MED ORDER — LIDOCAINE HCL (PF) 2 % IJ SOLN
INTRAMUSCULAR | Status: AC
  Filled 2020-06-02: qty 5

## 2020-06-02 NOTE — H&P (Signed)
Outpatient short stay form Pre-procedure 06/02/2020 8:28 AM Clella Mckeel K. Alice Reichert, M.D.  Primary Physician: Barbaraann Boys, M.D.  Reason for visit:  Personal history of adenomatous colon polyps (2018), GERD  History of present illness: Pleasant 69 y/o male with GERD mostly controlled with PPI and H2RA. Patient denies intractable heartburn, dysphagia, hemetemesis, abdominal pain, nausea or vomiting.                            Patient presents for colonoscopy for a personal hx of colon polyps. The patient denies abdominal pain, abnormal weight loss or rectal bleeding.      Current Facility-Administered Medications:  .  0.9 %  sodium chloride infusion, , Intravenous, Continuous, Goodman, Benay Pike, MD, Last Rate: 20 mL/hr at 06/02/20 0821, New Bag at 06/02/20 0821 .  butamben-tetracaine-benzocaine (CETACAINE) 12-28-12 % spray, , , ,   Medications Prior to Admission  Medication Sig Dispense Refill Last Dose  . acetaminophen (TYLENOL) 500 MG tablet Take 500 mg by mouth every 6 (six) hours as needed.     Marland Kitchen amLODipine (NORVASC) 2.5 MG tablet Take 2.5 mg by mouth daily.   06/02/2020 at 0545  . aspirin EC 81 MG tablet Take 81 mg by mouth daily.   05/25/2020 at Unknown time  . carvedilol (COREG) 12.5 MG tablet Take 12.5 mg by mouth 2 (two) times daily with a meal.   06/02/2020 at 0545  . Cholecalciferol (VITAMIN D3 PO) Take 1,000 Units by mouth.     . clopidogrel (PLAVIX) 75 MG tablet Take 75 mg by mouth daily.   05/25/2020 at Unknown time  . cyclobenzaprine (FLEXERIL) 5 MG tablet Take 5 mg by mouth 3 (three) times daily as needed for muscle spasms.     . famotidine (PEPCID) 10 MG tablet Take 10 mg by mouth 2 (two) times daily.     . fluticasone (FLONASE) 50 MCG/ACT nasal spray Place into both nostrils daily.     Marland Kitchen guaiFENesin (HERBAL EXPEC PO) Take by mouth.     Marland Kitchen lisinopril (PRINIVIL,ZESTRIL) 10 MG tablet Take 10 mg by mouth daily.   06/02/2020 at 0545  . spironolactone (ALDACTONE) 25 MG tablet Take 25 mg by  mouth daily.     Marland Kitchen albuterol (PROVENTIL HFA;VENTOLIN HFA) 108 (90 Base) MCG/ACT inhaler Inhale 2 puffs into the lungs every 6 (six) hours as needed for wheezing or shortness of breath.     Marland Kitchen azelastine (ASTELIN) 0.1 % nasal spray Place 1 spray into both nostrils 2 (two) times daily. Use in each nostril as directed     . azelastine (OPTIVAR) 0.05 % ophthalmic solution Place into both eyes 2 (two) times daily.     . cetirizine (ZYRTEC) 10 MG tablet Take 10 mg by mouth daily.     . chlorpheniramine-HYDROcodone (TUSSIONEX PENNKINETIC ER) 10-8 MG/5ML SUER Take 5 mLs by mouth at bedtime as needed. do not drive or operate machinery while taking as can cause drowsiness. 50 mL 0   . Diphenhydramine-APAP 25-500 MG TABS Take 500 mg by mouth every evening.     . docusate sodium (COLACE) 100 MG capsule Take 1 tablet once or twice daily as needed for constipation while taking narcotic pain medicine 30 capsule 0   . doxycycline (VIBRAMYCIN) 100 MG capsule Take 1 capsule (100 mg total) by mouth 2 (two) times daily. 20 capsule 0   . fexofenadine (ALLEGRA) 180 MG tablet Take by mouth.     Marland Kitchen glipiZIDE (GLUCOTROL)  10 MG tablet Take 10 mg by mouth daily before breakfast.     . HYDROcodone-acetaminophen (NORCO/VICODIN) 5-325 MG tablet Take 1-2 tablets by mouth every 4 (four) hours as needed for moderate pain. 15 tablet 0   . isosorbide mononitrate (IMDUR) 30 MG 24 hr tablet Take by mouth.     Marland Kitchen ketotifen (ZADITOR) 0.025 % ophthalmic solution Apply to eye.     Marland Kitchen LORazepam (ATIVAN) 0.5 MG tablet Take 0.5 mg by mouth every 8 (eight) hours. takr 1/2 tab as needed     . metFORMIN (GLUMETZA) 1000 MG (MOD) 24 hr tablet Take 1,000 mg by mouth 2 (two) times daily with a meal.     . montelukast (SINGULAIR) 10 MG tablet Take by mouth.     . nitroGLYCERIN (NITROSTAT) 0.4 MG SL tablet Place 0.4 mg under the tongue every 5 (five) minutes as needed for chest pain.     . pantoprazole (PROTONIX) 40 MG tablet Take 40 mg by mouth daily.      . predniSONE (DELTASONE) 20 MG tablet Take 2 tablets (40 mg total) by mouth daily. 10 tablet 0   . Propylhexedrine (BENZEDREX) INHA Place into the nose.     . rosuvastatin (CRESTOR) 40 MG tablet Take by mouth.     . ticagrelor (BRILINTA) 90 MG TABS tablet Take by mouth.     Marland Kitchen tiZANidine (ZANAFLEX) 2 MG tablet Take 2 mg by mouth every 8 (eight) hours as needed for muscle spasms.        No Known Allergies   Past Medical History:  Diagnosis Date  . Abnormal vision    overhanging eyelids  . Allergic rhinitis   . Anxiety   . Asthma   . Coronary artery disease   . Diabetes mellitus without complication ([place])   . Erectile dysfunction   . GERD (gastroesophageal reflux disease)   . Glaucoma   . Hyperlipidemia   . Hyperplastic colon polyp   . Hypertension   . Insomnia   . Myocardial infarct ([place])   . Tubular adenoma of colon     Review of systems:  Otherwise negative.    Physical Exam  Gen: Alert, oriented. Appears stated age.  HEENT: [place]/AT. PERRLA. Lungs: CTA, no wheezes. CV: RR nl S1, S2. Abd: soft, benign, no masses. BS+ Ext: No edema. Pulses 2+    Planned procedures: Proceed with EGD and colonoscopy. The patient understands the nature of the planned procedure, indications, risks, alternatives and potential complications including but not limited to bleeding, infection, perforation, damage to internal organs and possible oversedation/side effects from anesthesia. The patient agrees and gives consent to proceed.  Please refer to procedure notes for findings, recommendations and patient disposition/instructions.     Geneive Sandstrom K. Alice Reichert, M.D. Gastroenterology 06/02/2020  8:28 AM

## 2020-06-02 NOTE — Anesthesia Postprocedure Evaluation (Signed)
Anesthesia Post Note  Patient: Peter Morales  Procedure(s) Performed: COLONOSCOPY WITH PROPOFOL (N/A ) ESOPHAGOGASTRODUODENOSCOPY (EGD) WITH PROPOFOL (N/A )  Patient location during evaluation: Endoscopy Anesthesia Type: General Level of consciousness: awake and alert Pain management: pain level controlled Vital Signs Assessment: post-procedure vital signs reviewed and stable Respiratory status: spontaneous breathing, nonlabored ventilation, respiratory function stable and patient connected to nasal cannula oxygen Cardiovascular status: blood pressure returned to baseline and stable Postop Assessment: no apparent nausea or vomiting Anesthetic complications: no   No complications documented.   Last Vitals:  Vitals:   06/02/20 0924 06/02/20 0934  BP: (!) 87/52 (!) 92/55  Pulse:    Resp:    Temp:    SpO2:      Last Pain:  Vitals:   06/02/20 0934  TempSrc:   PainSc: 0-No pain                 Arita Miss

## 2020-06-02 NOTE — Anesthesia Preprocedure Evaluation (Addendum)
Anesthesia Evaluation  Patient identified by MRN, date of birth, ID band Patient awake    Reviewed: Allergy & Precautions, H&P , NPO status , Patient's Chart, lab work & pertinent test results  Airway Mallampati: II  TM Distance: >3 FB Neck ROM: limited    Dental  (+) Poor Dentition, Chipped, Missing   Pulmonary neg pulmonary ROS, neg shortness of breath, former smoker,    Pulmonary exam normal breath sounds clear to auscultation       Cardiovascular Exercise Tolerance: Good hypertension, (-) angina+ CAD, + Past MI, + Cardiac Stents and + CABG  Normal cardiovascular exam Rhythm:regular Rate:Normal  TTE 2018: NORMAL LEFT VENTRICULAR SYSTOLIC FUNCTION WITH MILD LVH  NORMAL RIGHT VENTRICULAR SYSTOLIC FUNCTION  VALVULAR REGURGITATION: TRIVIAL MR, TRIVIAL PR  NO VALVULAR STENOSIS  LVEF 50-55%   LHC 2018:  Severe 3 vessel CAD.  Patent LIMA to LAD.  Patent SVG to OM1  3 areas of stenosis in the mid RCA, distal RCA and PLV1.  Successful stenting of the mid RCA, distal RCA and PLV1 with drug eluting stents.     Neuro/Psych PSYCHIATRIC DISORDERS Anxiety negative neurological ROS     GI/Hepatic Neg liver ROS, GERD  Controlled,  Endo/Other  diabetes, Type 2  Renal/GU negative Renal ROS  negative genitourinary   Musculoskeletal   Abdominal   Peds  Hematology negative hematology ROS (+)   Anesthesia Other Findings Past Medical History: No date: Anxiety No date: Coronary artery disease No date: Diabetes mellitus without complication ([facility]) No date: Erectile dysfunction No date: GERD (gastroesophageal reflux disease) No date: Glaucoma No date: Hyperlipidemia No date: Hypertension No date: Insomnia No date: Myocardial infarct  Past Surgical History: No date: CARDIAC CATHETERIZATION No date: CARDIAC SURGERY No date: CORONARY ARTERY BYPASS GRAFT     Reproductive/Obstetrics negative OB ROS                             Anesthesia Physical  Anesthesia Plan  ASA: III  Anesthesia Plan: General   Post-op Pain Management:    Induction: Intravenous  PONV Risk Score and Plan: 2 and Ondansetron, Propofol infusion and TIVA  Airway Management Planned: Nasal Cannula  Additional Equipment: None  Intra-op Plan:   Post-operative Plan:   Informed Consent: I have reviewed the patients History and Physical, chart, labs and discussed the procedure including the risks, benefits and alternatives for the proposed anesthesia with the patient or authorized representative who has indicated his/her understanding and acceptance.       Plan Discussed with: CRNA and Surgeon  Anesthesia Plan Comments: (Discussed risks of anesthesia with patient, including possibility of difficulty with spontaneous ventilation under anesthesia necessitating airway intervention, PONV, and rare risks such as cardiac or respiratory or neurological events. Patient understands.)       Anesthesia Quick Evaluation

## 2020-06-02 NOTE — Anesthesia Procedure Notes (Signed)
Performed by: Cook-Martin, Hersey Maclellan Pre-anesthesia Checklist: Patient identified, Emergency Drugs available, Suction available, Patient being monitored and Timeout performed Patient Re-evaluated:Patient Re-evaluated prior to induction Oxygen Delivery Method: Nasal cannula Preoxygenation: Pre-oxygenation with 100% oxygen Induction Type: IV induction Airway Equipment and Method: Bite block Placement Confirmation: positive ETCO2 and CO2 detector       

## 2020-06-02 NOTE — Op Note (Signed)
[facility] Patient Name: Peter Morales Procedure Date: 06/02/2020 8:24 AM MRN: [id number] Account #: [id number] Date of Birth: Apr 17, 1951 Admit Type: Outpatient Age: 69 Room: [facility] ENDO ROOM 1 Gender: Male Note Status: Finalized Procedure:             Colonoscopy Indications:           Surveillance: Personal history of adenomatous polyps                         on last colonoscopy > 3 years ago Providers:             Lorie Apley K. Eriel Dunckel MD, MD Medicines:             Propofol per Anesthesia Complications:         No immediate complications. Procedure:             Pre-Anesthesia Assessment:                        - The risks and benefits of the procedure and the                         sedation options and risks were discussed with the                         patient. All questions were answered and informed                         consent was obtained.                        - Patient identification and proposed procedure were                         verified prior to the procedure by the nurse. The                         procedure was verified in the procedure room.                        - ASA Grade Assessment: III - A patient with severe                         systemic disease.                        - After reviewing the risks and benefits, the patient                         was deemed in satisfactory condition to undergo the                         procedure.                        After obtaining informed consent, the colonoscope was                         passed under direct vision. Throughout the procedure,  the patient's blood pressure, pulse, and oxygen                         saturations were monitored continuously. The                         Colonoscope was introduced through the anus and                         advanced to the the cecum, identified by appendiceal                         orifice and ileocecal  valve. The colonoscopy was                         somewhat difficult due to significant looping.                         Successful completion of the procedure was aided by                         changing the patient to a prone position. The patient                         tolerated the procedure well. The quality of the bowel                         preparation was adequate. The ileocecal valve,                         appendiceal orifice, and rectum were photographed. Findings:      The perianal and digital rectal examinations were normal. Pertinent       negatives include normal sphincter tone and no palpable rectal lesions.      A 4 mm polyp was found in the proximal sigmoid colon. The polyp was       sessile. The polyp was removed with a jumbo cold forceps. Resection and       retrieval were complete.      The exam was otherwise without abnormality on direct and retroflexion       views. Impression:            - One 4 mm polyp in the proximal sigmoid colon,                         removed with a jumbo cold forceps. Resected and                         retrieved.                        - The examination was otherwise normal on direct and                         retroflexion views. Recommendation:        - EGD, also performed today, was normal.                        - Patient has a contact number available  for                         emergencies. The signs and symptoms of potential                         delayed complications were discussed with the patient.                         Return to normal activities tomorrow. Written                         discharge instructions were provided to the patient.                        - Resume previous diet.                        - Continue present medications.                        - Repeat colonoscopy is recommended for surveillance.                         The colonoscopy date will be determined after                         pathology  results from today's exam become available                         for review.                        - Return to GI office as previously scheduled.                        - The findings and recommendations were discussed with                         the patient. Procedure Code(s):     --- Professional ---                        [phone], Colonoscopy, flexible; with biopsy, single or                         multiple Diagnosis Code(s):     --- Professional ---                        K63.5, Polyp of colon                        Z86.010, Personal history of colonic polyps CPT copyright 2019 American [employer] Association. All rights reserved. The codes documented in this report are preliminary and upon coder review may  be revised to meet current compliance requirements. Efrain Sella MD, MD 06/02/2020 9:02:44 AM This report has been signed electronically. Number of Addenda: 0 Note Initiated On: 06/02/2020 8:24 AM Scope Withdrawal Time: 0 hours 8 minutes 57 seconds  Total Procedure Duration: 0 hours 18 minutes 3 seconds  Estimated Blood Loss:  Estimated blood loss: none.  [facility]

## 2020-06-02 NOTE — Op Note (Signed)
[facility]astroenter[facility] Patient Name: Peter Morales Procedure Date: 06/02/2020 8:25 AM MRN: [id number] Account #: [id number] Date of Birth: January 08, 1951 Admit Type: Outpatient Age: 69 Room: [facility] ENDO ROOM 1 Gender: Male Note Status: Finalized Procedure:             Upper GI endoscopy Indications:           Gastro-esophageal reflux disease Providers:             Benay Pike. Alice Reichert MD, MD Referring MD:          Ane Payment, MD (Referring MD) Medicines:             Propofol per Anesthesia Complications:         No immediate complications. Procedure:             Pre-Anesthesia Assessment:                        - The risks and benefits of the procedure and the                         sedation options and risks were discussed with the                         patient. All questions were answered and informed                         consent was obtained.                        - Patient identification and proposed procedure were                         verified prior to the procedure by the nurse. The                         procedure was verified in the procedure room.                        - ASA Grade Assessment: III - A patient with severe                         systemic disease.                        - After reviewing the risks and benefits, the patient                         was deemed in satisfactory condition to undergo the                         procedure.                        After obtaining informed consent, the endoscope was                         passed under direct vision. Throughout the procedure,                         the patient's blood pressure,  pulse, and oxygen                         saturations were monitored continuously. The Endoscope                         was introduced through the mouth, and advanced to the                         third part of duodenum. The upper GI endoscopy was                         accomplished without  difficulty. The patient tolerated                         the procedure well. Findings:      The esophagus was normal.      The stomach was normal.      The examined duodenum was normal. Impression:            - Normal esophagus.                        - Normal stomach.                        - Normal examined duodenum.                        - No specimens collected. Recommendation:        - Proceed with colonoscopy Procedure Code(s):     --- Professional ---                        [phone], Esophagogastroduodenoscopy, flexible,                         transoral; diagnostic, including collection of                         specimen(s) by brushing or washing, when performed                         (separate procedure) Diagnosis Code(s):     --- Professional ---                        K21.9, Gastro-esophageal reflux disease without                         esophagitis CPT copyright 2019 American Medical Association. All rights reserved. The codes documented in this report are preliminary and upon coder review may  be revised to meet current compliance requirements. Efrain Sella MD, MD 06/02/2020 8:38:57 AM This report has been signed electronically. Number of Addenda: 0 Note Initiated On: 06/02/2020 8:25 AM Estimated Blood Loss:  Estimated blood loss: none.      [facility]

## 2020-06-02 NOTE — Interval H&P Note (Signed)
History and Physical Interval Note:  06/02/2020 8:29 AM  Peter Morales  has presented today for surgery, with the diagnosis of [place] POLYPS GERD.  The various methods of treatment have been discussed with the patient and family. After consideration of risks, benefits and other options for treatment, the patient has consented to  Procedure(s): COLONOSCOPY WITH PROPOFOL (N/A) ESOPHAGOGASTRODUODENOSCOPY (EGD) WITH PROPOFOL (N/A) as a surgical intervention.  The patient's history has been reviewed, patient examined, no change in status, stable for surgery.  I have reviewed the patient's chart and labs.  Questions were answered to the patient's satisfaction.     [place], [place]

## 2020-06-02 NOTE — Transfer of Care (Signed)
Immediate Anesthesia Transfer of Care Note  Patient: Peter Morales  Procedure(s) Performed: COLONOSCOPY WITH PROPOFOL (N/A ) ESOPHAGOGASTRODUODENOSCOPY (EGD) WITH PROPOFOL (N/A )  Patient Location: PACU  Anesthesia Type:General  Level of Consciousness: awake and sedated  Airway & Oxygen Therapy: Patient Spontanous Breathing and Patient connected to nasal cannula oxygen  Post-op Assessment: Report given to RN and Post -op Vital signs reviewed and stable  Post vital signs: Reviewed and stable  Last Vitals:  Vitals Value Taken Time  BP    Temp    Pulse    Resp    SpO2      Last Pain:  Vitals:   06/02/20 0749  TempSrc: Temporal  PainSc: 3          Complications: No complications documented.

## 2020-06-03 ENCOUNTER — Encounter: Payer: Self-pay | Admitting: Internal Medicine

## 2020-06-03 LAB — SURGICAL PATHOLOGY

## 2021-05-04 DIAGNOSIS — M5430 Sciatica, unspecified side: Secondary | ICD-10-CM | POA: Insufficient documentation

## 2021-08-04 DIAGNOSIS — Z7189 Other specified counseling: Secondary | ICD-10-CM | POA: Insufficient documentation

## 2021-11-26 ENCOUNTER — Other Ambulatory Visit: Payer: Self-pay

## 2021-11-26 ENCOUNTER — Ambulatory Visit
Admission: EM | Admit: 2021-11-26 | Discharge: 2021-11-26 | Disposition: A | Discharge status: Home / Self Care | Payer: Medicare Other | Attending: Emergency Medicine | Admitting: Emergency Medicine

## 2021-11-26 DIAGNOSIS — J40 Bronchitis, not specified as acute or chronic: Secondary | ICD-10-CM

## 2021-11-26 MED ORDER — PROMETHAZINE-DM 6.25-15 MG/5ML PO SYRP: 5.0000 mL | ORAL_SOLUTION | Freq: Four times a day (QID) | ORAL | 0 refills | Status: DC | PRN

## 2021-11-26 MED ORDER — BENZONATATE 100 MG PO CAPS: 200.0000 mg | ORAL_CAPSULE | Freq: Three times a day (TID) | ORAL | 0 refills | Status: DC

## 2021-11-26 NOTE — ED Provider Notes (Signed)
[facility]    CSN: [id number] Arrival date & time: 11/26/21  1213      History   Chief Complaint Chief Complaint  Patient presents with   Cough    HPI Peter Morales is a 71 y.o. male.   HPI  71 year old male here for evaluation of respiratory complaints.  Patient reports that he has been experiencing a cough for the past week.  The cough is intermittently productive for a clear sputum but it is also incessant and is accompanied with wheezing.  Patient reports that he will cough so hard that he give himself a headache and he feels short of breath from the coughing.  He does not endorse some mild nasal congestion but denies any nasal discharge, fever, sore throat, ear pain, chest pain, or leg swelling.  Patient is concerned because he has a large cardiac history and he has multiple stents in his heart.  Past Medical History:  Diagnosis Date   Abnormal vision    overhanging eyelids   Allergic rhinitis    Anxiety    Asthma    Coronary artery disease    Diabetes mellitus without complication (HCC)    Erectile dysfunction    GERD (gastroesophageal reflux disease)    Glaucoma    Hyperlipidemia    Hyperplastic colon polyp    Hypertension    Insomnia    Myocardial infarct (HCC)    Tubular adenoma of colon     Patient Active Problem List   Diagnosis Date Noted   NSTEMI (non-ST elevated myocardial infarction) ([place]) 11/06/2017   Unstable angina ([place]) 11/06/2017   Allergic rhinitis 09/09/2017   Erectile disorder due to medical condition in male patient 04/27/2016   Mixed dyslipidemia 02/01/2016   Controlled type 2 diabetes mellitus with circulatory disorder ([place]) 01/25/2016   Coronary artery disease involving native coronary artery of native heart with angina pectoris ([place]) 01/25/2016   Gastroesophageal reflux disease with esophagitis 01/25/2016   Hypertension, well controlled 01/25/2016   Insomnia, persistent 01/25/2016    Past Surgical History:  Procedure  Laterality Date   CARDIAC CATHETERIZATION     CARDIAC SURGERY     COLONOSCOPY WITH PROPOFOL N/A 01/11/2017   Procedure: COLONOSCOPY WITH PROPOFOL;  Surgeon: Lollie Sails, MD;  Location: [facility] ENDOSCOPY;  Service: Endoscopy;  Laterality: N/A;   COLONOSCOPY WITH PROPOFOL N/A 06/02/2020   Procedure: COLONOSCOPY WITH PROPOFOL;  Surgeon: Toledo, Benay Pike, MD;  Location: [facility] ENDOSCOPY;  Service: Gastroenterology;  Laterality: N/A;   CORONARY ANGIOPLASTY     CORONARY ARTERY BYPASS GRAFT     ESOPHAGOGASTRODUODENOSCOPY (EGD) WITH PROPOFOL N/A 06/02/2020   Procedure: ESOPHAGOGASTRODUODENOSCOPY (EGD) WITH PROPOFOL;  Surgeon: Toledo, Benay Pike, MD;  Location: [facility] ENDOSCOPY;  Service: Gastroenterology;  Laterality: N/A;       Home Medications    Prior to Admission medications   Medication Sig Start Date End Date Taking? Authorizing Provider  acetaminophen (TYLENOL) 500 MG tablet Take 500 mg by mouth every 6 (six) hours as needed.   Yes [provider]  albuterol (PROVENTIL HFA;VENTOLIN HFA) 108 (90 Base) MCG/ACT inhaler Inhale 2 puffs into the lungs every 6 (six) hours as needed for wheezing or shortness of breath.   Yes [provider]  amLODipine (NORVASC) 2.5 MG tablet Take 2.5 mg by mouth daily.   Yes [provider]  aspirin EC 81 MG tablet Take 81 mg by mouth daily.   Yes [provider]  azelastine (ASTELIN) 0.1 % nasal spray Place 1 spray  into both nostrils 2 (two) times daily. Use in each nostril as directed   Yes [provider]  azelastine (OPTIVAR) 0.05 % ophthalmic solution Place into both eyes 2 (two) times daily.   Yes [provider]  benzonatate (TESSALON) 100 MG capsule Take 2 capsules (200 mg total) by mouth every 8 (eight) hours. 11/26/21  Yes Margarette Canada, NP  carvedilol (COREG) 12.5 MG tablet Take 12.5 mg by mouth 2 (two) times daily with a meal.   Yes [provider]  cetirizine (ZYRTEC) 10 MG tablet Take 10 mg by  mouth daily.   Yes [provider]  Cholecalciferol (VITAMIN D3 PO) Take 1,000 Units by mouth.   Yes [provider]  clopidogrel (PLAVIX) 75 MG tablet Take 75 mg by mouth daily.   Yes [provider]  cyclobenzaprine (FLEXERIL) 5 MG tablet Take 5 mg by mouth 3 (three) times daily as needed for muscle spasms.   Yes [provider]  Diphenhydramine-APAP 25-500 MG TABS Take 500 mg by mouth every evening.   Yes [provider]  famotidine (PEPCID) 10 MG tablet Take 10 mg by mouth 2 (two) times daily.   Yes [provider]  fexofenadine (ALLEGRA) 180 MG tablet Take by mouth.   Yes [provider]  fluticasone (FLONASE) 50 MCG/ACT nasal spray Place into both nostrils daily.   Yes [provider]  glipiZIDE (GLUCOTROL) 10 MG tablet Take 10 mg by mouth daily before breakfast.   Yes [provider]  guaiFENesin (HERBAL EXPEC PO) Take by mouth.   Yes [provider]  ketotifen (ZADITOR) 0.025 % ophthalmic solution Apply to eye. 09/09/17  Yes [provider]  lisinopril (PRINIVIL,ZESTRIL) 10 MG tablet Take 10 mg by mouth daily.   Yes [provider]  LORazepam (ATIVAN) 0.5 MG tablet Take 0.5 mg by mouth every 8 (eight) hours. takr 1/2 tab as needed   Yes [provider]  metFORMIN (GLUMETZA) 1000 MG (MOD) 24 hr tablet Take 1,000 mg by mouth 2 (two) times daily with a meal.   Yes [provider]  nitroGLYCERIN (NITROSTAT) 0.4 MG SL tablet Place 0.4 mg under the tongue every 5 (five) minutes as needed for chest pain.   Yes [provider]  pantoprazole (PROTONIX) 40 MG tablet Take 40 mg by mouth daily.   Yes [provider]  promethazine-dextromethorphan (PROMETHAZINE-DM) 6.25-15 MG/5ML syrup Take 5 mLs by mouth 4 (four) times daily as needed. 11/26/21  Yes Margarette Canada, NP  Propylhexedrine INHA Place into the nose.   Yes [provider]  spironolactone  (ALDACTONE) 25 MG tablet Take 25 mg by mouth daily.   Yes [provider]  ticagrelor (BRILINTA) 90 MG TABS tablet Take by mouth. 11/08/17  Yes [provider]  tiZANidine (ZANAFLEX) 2 MG tablet Take 2 mg by mouth every 8 (eight) hours as needed for muscle spasms.   Yes [provider]  isosorbide mononitrate (IMDUR) 30 MG 24 hr tablet Take by mouth. 03/18/17 10/17/18  [provider]  montelukast (SINGULAIR) 10 MG tablet Take by mouth. 09/09/17 09/09/18  [provider]  rosuvastatin (CRESTOR) 40 MG tablet Take by mouth. 11/13/17 11/13/18  [provider]    Family History Family History  Problem Relation Age of Onset   Arthritis Mother    Heart attack Father    Arthritis Father    Prostate cancer Brother    Coronary artery disease Brother     Social History Social History  Tobacco Use   Smoking status: Former    Types: Cigarettes    Quit date: 07/23/1983    Years since quitting: 38.3   Smokeless tobacco: Never  Vaping Use   Vaping Use: Never used  Substance Use Topics   Alcohol use: Yes    Comment: occasional    Drug use: No     Allergies   Patient has no known allergies.   Review of Systems Review of Systems  Constitutional:  Negative for activity change, appetite change and fever.  HENT:  Positive for congestion. Negative for ear pain, rhinorrhea and sore throat.   Respiratory:  Positive for cough, shortness of breath and wheezing.   Cardiovascular:  Negative for chest pain, palpitations and leg swelling.  Hematological: Negative.   Psychiatric/Behavioral: Negative.      Physical Exam Triage Vital Signs ED Triage Vitals  Enc Vitals Group     BP 11/26/21 1435 (!) 150/75     Pulse Rate 11/26/21 1435 91     Resp 11/26/21 1435 18     Temp 11/26/21 1435 98.9 F (37.2 C)     Temp Source 11/26/21 1435 Oral     SpO2 11/26/21 1435 97 %     Weight 11/26/21 1434 200 lb (90.7 kg)     Height 11/26/21 1434 5\' 5"   (1.651 m)     Head Circumference --      Peak Flow --      Pain Score 11/26/21 1433 0     Pain Loc --      Pain Edu? --      Excl. in [place]? --    No data found.  Updated Vital Signs BP (!) 150/75 (BP Location: Left Arm)    Pulse 91    Temp 98.9 F (37.2 C) (Oral)    Resp 18    Ht 5\' 5"  (1.651 m)    Wt 200 lb (90.7 kg)    SpO2 97%    BMI 33.28 kg/m   Visual Acuity Right Eye Distance:   Left Eye Distance:   Bilateral Distance:    Right Eye Near:   Left Eye Near:    Bilateral Near:     Physical Exam Vitals and nursing note reviewed.  Constitutional:      General: He is not in acute distress.    Appearance: Normal appearance. He is normal weight. He is not ill-appearing.  HENT:     Head: Normocephalic and atraumatic.     Right Ear: Tympanic membrane, ear canal and external ear normal. There is no impacted cerumen.     Left Ear: Tympanic membrane, ear canal and external ear normal. There is no impacted cerumen.     Nose: Nose normal. No congestion or rhinorrhea.     Mouth/Throat:     Mouth: Mucous membranes are moist.     Pharynx: Oropharynx is clear. No oropharyngeal exudate or posterior oropharyngeal erythema.  Cardiovascular:     Rate and Rhythm: Normal rate and regular rhythm.     Pulses: Normal pulses.     Heart sounds: Normal heart sounds. No murmur heard.   No friction rub. No gallop.  Pulmonary:     Effort: Pulmonary effort is normal.     Breath sounds: Wheezing and rhonchi present. No rales.  Musculoskeletal:     Cervical back: Normal range of motion and neck supple.  Lymphadenopathy:     Cervical: No cervical adenopathy.  Skin:    General: Skin is warm and dry.  Capillary Refill: Capillary refill takes less than 2 seconds.     Findings: No erythema or rash.  Neurological:     General: No focal deficit present.     Mental Status: He is alert and oriented to person, place, and time.  Psychiatric:        Mood and Affect: Mood normal.        Behavior:  Behavior normal.        Thought Content: Thought content normal.        Judgment: Judgment normal.     UC Treatments / Results  Labs (all labs ordered are listed, but only abnormal results are displayed) Labs Reviewed - No data to display  EKG   Radiology No results found.  Procedures Procedures (including critical care time)  Medications Ordered in UC Medications - No data to display  Initial Impression / Assessment and Plan / UC Course  I have reviewed the triage vital signs and the nursing notes.  Pertinent labs & imaging results that were available during my care of the patient were reviewed by me and considered in my medical decision making (see chart for details).  Patient is a pleasant, nontoxic-appearing 71 year old male here for evaluation of ongoing cough that is been present for the past week and is associated with wheezing, and intermittent production of clear sputum.  He is also had some nasal congestion.  He reports headache on occasion when he has to cough really hard as well as some shortness of breath a result of coughing but not at baseline.  No chest pain, leg swelling, or other upper respiratory concerns.  Physical exam reveals pearly gray tympanic membranes bilaterally with normal light reflex and clear external auditory canals.  Nasal mucosa is pink and moist without erythema, edema, or discharge.  Oropharyngeal exam is benign.  No cervical lymphadenopathy appreciated exam.  Cardiopulmonary exam reveals faint expiratory wheezes and rhonchi in the upper middle lobes bilaterally.  Patient exam is consistent with bronchitis.  We will treat with Perles and Promethazine DM cough syrup.  Patient has not butyryl inhaler and have instructed him to use this every 4-6 hours as needed for shortness of breath and wheezing.  Patient also requested to follow-up with his primary care provider if his symptoms not improve.   Final Clinical Impressions(s) / UC Diagnoses   Final  diagnoses:  Bronchitis     Discharge Instructions      Use the albuterol inhaler/nebulizer every 4-6 hours as needed for shortness of breath, wheezing, and cough.  Use the Tessalon Perles every 8 hours for your cough.  Taken with a small sip of water.  They may give you some numbness to the base of your tongue or metallic taste in her mouth, this is normal.  They are designed to calm down the cough reflex.  Use the Promethazine DM cough syrup at bedtime as will make you drowsy.  You may take 1 teaspoon (5 mL) every 6 hours.  Return for reevaluation for new or worsening symptoms.      ED Prescriptions     Medication Sig Dispense Auth. Provider   benzonatate (TESSALON) 100 MG capsule Take 2 capsules (200 mg total) by mouth every 8 (eight) hours. 21 capsule Margarette Canada, NP   promethazine-dextromethorphan (PROMETHAZINE-DM) 6.25-15 MG/5ML syrup Take 5 mLs by mouth 4 (four) times daily as needed. 118 mL Margarette Canada, NP      PDMP not reviewed this encounter.   Margarette Canada, NP 11/26/21 1524

## 2021-11-26 NOTE — ED Triage Notes (Signed)
Pt here with C/O cough for 1 week, has since started getting Short of breath.

## 2021-11-26 NOTE — Discharge Instructions (Addendum)
Use the albuterol inhaler/nebulizer every 4-6 hours as needed for shortness of breath, wheezing, and cough.  Use the Tessalon Perles every 8 hours for your cough.  Taken with a small sip of water.  They may give you some numbness to the base of your tongue or metallic taste in her mouth, this is normal.  They are designed to calm down the cough reflex.  Use the Promethazine DM cough syrup at bedtime as will make you drowsy.  You may take 1 teaspoon (5 mL) every 6 hours.  Return for reevaluation for new or worsening symptoms.

## 2022-01-16 ENCOUNTER — Other Ambulatory Visit: Payer: Self-pay

## 2022-01-16 ENCOUNTER — Ambulatory Visit
Admission: RE | Admit: 2022-01-16 | Discharge: 2022-01-16 | Disposition: A | Discharge status: Home / Self Care | Payer: Medicare Other | Source: Ambulatory Visit | Attending: Emergency Medicine | Admitting: Emergency Medicine

## 2022-01-16 VITALS — BP 138/70 | HR 79 | Temp 98.7°F | Resp 18

## 2022-01-16 DIAGNOSIS — J0100 Acute maxillary sinusitis, unspecified: Secondary | ICD-10-CM

## 2022-01-16 MED ORDER — AMOXICILLIN-POT CLAVULANATE 875-125 MG PO TABS: 1.0000 | ORAL_TABLET | Freq: Two times a day (BID) | ORAL | 0 refills | Status: DC

## 2022-01-16 MED ORDER — BENZONATATE 100 MG PO CAPS
200.0000 mg | ORAL_CAPSULE | Freq: Three times a day (TID) | ORAL | 0 refills | Status: DC
Start: 2022-01-16 — End: 2022-08-14

## 2022-01-16 MED ORDER — PROMETHAZINE-DM 6.25-15 MG/5ML PO SYRP: 5.0000 mL | ORAL_SOLUTION | Freq: Four times a day (QID) | ORAL | 0 refills | Status: DC | PRN

## 2022-01-16 MED ORDER — AMOXICILLIN-POT CLAVULANATE 875-125 MG PO TABS: 1.0000 | ORAL_TABLET | Freq: Two times a day (BID) | ORAL | 0 refills | Status: AC

## 2022-01-16 MED ORDER — BENZONATATE 100 MG PO CAPS: 200.0000 mg | ORAL_CAPSULE | Freq: Three times a day (TID) | ORAL | 0 refills | Status: DC

## 2022-01-16 MED ORDER — AZELASTINE HCL 0.1 % NA SOLN: 1.0000 | Freq: Two times a day (BID) | NASAL | 0 refills | Status: DC

## 2022-01-16 NOTE — Discharge Instructions (Signed)
The Augmentin twice daily with food for 10 days for treatment of your sinusitis.  Perform sinus irrigation 2-3 times a day with a [employer] sinus rinse kit and distilled water.  Do not use tap water.  You can use plain over-the-counter Mucinex every 6 hours to break up the stickiness of the mucus so your body can clear it.  Increase your oral fluid intake to thin out your mucus so that is also able for your body to clear more easily.  Take an over-the-counter probiotic, such as Culturelle-align-activia, 1 hour after each dose of antibiotic to prevent diarrhea.  Use the tessalon perles during the day as needed for cough and the promethazine DM at bedtime.  If you develop any new or worsening symptoms return for reevaluation or see your primary care provider.

## 2022-01-16 NOTE — ED Provider Notes (Signed)
[facility]    CSN: [id number] Arrival date & time: 01/16/22  1233      History   Chief Complaint Chief Complaint  Patient presents with   Appointment   Sore Throat         HPI Peter Morales is a 71 y.o. male.   HPI  71 year old male here for evaluation of respiratory complaints.  Patient reports that for the last 7 to 10 days he has been experiencing runny nose with yellow to green nasal discharge.  Patient reports that he is experiencing sinus pressure on the left-hand side of his face.  He also endorses sore throat that developed 5 to 6 days ago and a nonproductive cough that developed yesterday.  He denies any fever, ear pain, shortness of breath, or wheezing.  He has been using nasal saline to irrigate his nasal passages to help facilitate removal of mucus.  Yesterday he blew his nose and he had bloody mucus.  He has had bloody mucus on occasion since then but not with every time he blows his nose.  Past Medical History:  Diagnosis Date   Abnormal vision    overhanging eyelids   Allergic rhinitis    Anxiety    Asthma    Coronary artery disease    Diabetes mellitus without complication (HCC)    Erectile dysfunction    GERD (gastroesophageal reflux disease)    Glaucoma    Hyperlipidemia    Hyperplastic colon polyp    Hypertension    Insomnia    Myocardial infarct (HCC)    Tubular adenoma of colon     Patient Active Problem List   Diagnosis Date Noted   NSTEMI (non-ST elevated myocardial infarction) ([place]) 11/06/2017   Unstable angina ([place]) 11/06/2017   Allergic rhinitis 09/09/2017   Erectile disorder due to medical condition in male patient 04/27/2016   Mixed dyslipidemia 02/01/2016   Controlled type 2 diabetes mellitus with circulatory disorder ([place]) 01/25/2016   Coronary artery disease involving native coronary artery of native heart with angina pectoris ([place]) 01/25/2016   Gastroesophageal reflux disease with esophagitis 01/25/2016    Hypertension, well controlled 01/25/2016   Insomnia, persistent 01/25/2016    Past Surgical History:  Procedure Laterality Date   CARDIAC CATHETERIZATION     CARDIAC SURGERY     COLONOSCOPY WITH PROPOFOL N/A 01/11/2017   Procedure: COLONOSCOPY WITH PROPOFOL;  Surgeon: Lollie Sails, MD;  Location[facility] ENDOSCOPY;  Service: Endoscopy;  Laterality: N/A;   COLONOSCOPY WITH PROPOFOL N/A 06/02/2020   Procedure: COLONOSCOPY WITH PROPOFOL;  Surgeon: Toledo, Benay Pike, MD;  Location: [facility] ENDOSCOPY;  Service: Gastroenterology;  Laterality: N/A;   CORONARY ANGIOPLASTY     CORONARY ARTERY BYPASS GRAFT     ESOPHAGOGASTRODUODENOSCOPY (EGD) WITH PROPOFOL N/A 06/02/2020   Procedure: ESOPHAGOGASTRODUODENOSCOPY (EGD) WITH PROPOFOL;  Surgeon: Toledo, Benay Pike, MD;  Location: [facility] ENDOSCOPY;  Service: Gastroenterology;  Laterality: N/A;       Home Medications    Prior to Admission medications   Medication Sig Start Date End Date Taking? Authorizing Provider  acetaminophen (TYLENOL) 500 MG tablet Take 500 mg by mouth every 6 (six) hours as needed.    [provider]  albuterol (PROVENTIL HFA;VENTOLIN HFA) 108 (90 Base) MCG/ACT inhaler Inhale 2 puffs into the lungs every 6 (six) hours as needed for wheezing or shortness of breath.    [provider]  amLODipine (NORVASC) 2.5 MG tablet Take 2.5 mg by mouth daily.    [provider]  amoxicillin-clavulanate (AUGMENTIN) 875-125 MG tablet Take 1 tablet by mouth every 12 (twelve) hours for 10 days. 01/16/22 01/26/22  Margarette Canada, NP  aspirin EC 81 MG tablet Take 81 mg by mouth daily.    [provider]  azelastine (ASTELIN) 0.1 % nasal spray Place 1 spray into both nostrils 2 (two) times daily. Use in each nostril as directed 01/16/22   Margarette Canada, NP  azelastine (OPTIVAR) 0.05 % ophthalmic solution Place into both eyes 2 (two) times daily.    [provider]  benzonatate (TESSALON) 100 MG capsule Take 2 capsules  (200 mg total) by mouth every 8 (eight) hours. 01/16/22   Margarette Canada, NP  carvedilol (COREG) 12.5 MG tablet Take 12.5 mg by mouth 2 (two) times daily with a meal.    [provider]  cetirizine (ZYRTEC) 10 MG tablet Take 10 mg by mouth daily.    [provider]  Cholecalciferol (VITAMIN D3 PO) Take 1,000 Units by mouth.    [provider]  clopidogrel (PLAVIX) 75 MG tablet Take 75 mg by mouth daily.    [provider]  cyclobenzaprine (FLEXERIL) 5 MG tablet Take 5 mg by mouth 3 (three) times daily as needed for muscle spasms.    [provider]  Diphenhydramine-APAP 25-500 MG TABS Take 500 mg by mouth every evening.    [provider]  famotidine (PEPCID) 10 MG tablet Take 10 mg by mouth 2 (two) times daily.    [provider]  fexofenadine (ALLEGRA) 180 MG tablet Take by mouth.    [provider]  fluticasone (FLONASE) 50 MCG/ACT nasal spray Place into both nostrils daily.    [provider]  glipiZIDE (GLUCOTROL) 10 MG tablet Take 10 mg by mouth daily before breakfast.    [provider]  guaiFENesin (HERBAL EXPEC PO) Take by mouth.    [provider]  isosorbide mononitrate (IMDUR) 30 MG 24 hr tablet Take by mouth. 03/18/17 10/17/18  [provider]  ketotifen (ZADITOR) 0.025 % ophthalmic solution Apply to eye. 09/09/17   [provider]  lisinopril (PRINIVIL,ZESTRIL) 10 MG tablet Take 10 mg by mouth daily.    [provider]  LORazepam (ATIVAN) 0.5 MG tablet Take 0.5 mg by mouth every 8 (eight) hours. takr 1/2 tab as needed    [provider]  metFORMIN (GLUMETZA) 1000 MG (MOD) 24 hr tablet Take 1,000 mg by mouth 2 (two) times daily with a meal.    [provider]  montelukast (SINGULAIR) 10 MG tablet Take by mouth. 09/09/17 09/09/18  [provider]  nitroGLYCERIN (NITROSTAT) 0.4 MG SL tablet Place 0.4 mg under the tongue every 5 (five)  minutes as needed for chest pain.    [provider]  pantoprazole (PROTONIX) 40 MG tablet Take 40 mg by mouth daily.    [provider]  promethazine-dextromethorphan (PROMETHAZINE-DM) 6.25-15 MG/5ML syrup Take 5 mLs by mouth 4 (four) times daily as needed. 01/16/22   Margarette Canada, NP  Propylhexedrine INHA Place into the nose.    [provider]  rosuvastatin (CRESTOR) 40 MG tablet Take by mouth. 11/13/17 11/13/18  [provider]  spironolactone (ALDACTONE) 25 MG tablet Take 25 mg by mouth daily.    [provider]  ticagrelor (BRILINTA) 90 MG TABS tablet Take by mouth. 11/08/17   [provider]  tiZANidine (ZANAFLEX) 2 MG tablet Take 2 mg by mouth every 8 (eight) hours as needed for muscle spasms.    [provider]    Family History Family History  Problem Relation Age of Onset   Arthritis Mother    Heart attack Father    Arthritis Father    Prostate cancer Brother    Coronary artery disease Brother     Social History Social History   Tobacco Use   Smoking status: Former    Types: Cigarettes    Quit date: 07/23/1983    Years since quitting: 38.5   Smokeless tobacco: Never  Vaping Use   Vaping Use: Never used  Substance Use Topics   Alcohol use: Yes    Comment: occasional    Drug use: No     Allergies   Patient has no known allergies.   Review of Systems Review of Systems  Constitutional:  Negative for activity change, appetite change and fever.  HENT:  Positive for congestion, rhinorrhea, sinus pressure and sore throat. Negative for ear pain.   Respiratory:  Positive for cough. Negative for shortness of breath and wheezing.   Hematological: Negative.     Physical Exam Triage Vital Signs ED Triage Vitals  Enc Vitals Group     BP 01/16/22 1255 138/70     Pulse Rate 01/16/22 1255 79     Resp 01/16/22 1255 18     Temp 01/16/22 1255 98.7 F (37.1 C)     Temp Source 01/16/22 1255 Oral     SpO2  01/16/22 1255 96 %     Weight --      Height --      Head Circumference --      Peak Flow --      Pain Score 01/16/22 1259 8     Pain Loc --      Pain Edu? --      Excl. in [place]? --    No data found.  Updated Vital Signs BP 138/70 (BP Location: Left Arm)    Pulse 79    Temp 98.7 F (37.1 C) (Oral)    Resp 18    SpO2 96%   Visual Acuity Right Eye Distance:   Left Eye Distance:   Bilateral Distance:    Right Eye Near:   Left Eye Near:    Bilateral Near:     Physical Exam Vitals and nursing note reviewed.  Constitutional:      Appearance: Normal appearance. He is not ill-appearing.  HENT:     Head: Normocephalic and atraumatic.     Right Ear: Tympanic membrane, ear canal and external ear normal. There is no impacted cerumen.     Left Ear: Tympanic membrane, ear canal and external ear normal. There is no impacted cerumen.     Nose: Congestion and rhinorrhea present.     Mouth/Throat:     Mouth: Mucous membranes are moist.     Pharynx: Oropharynx is clear. Posterior oropharyngeal erythema present.  Cardiovascular:     Rate and Rhythm: Normal rate and regular rhythm.     Pulses: Normal pulses.     Heart sounds: Normal heart sounds. No murmur heard.   No friction rub. No gallop.  Pulmonary:     Effort: Pulmonary effort is normal.     Breath sounds: Normal breath sounds. No wheezing, rhonchi or rales.  Musculoskeletal:     Cervical back: Normal range of motion and neck supple.  Lymphadenopathy:     Cervical: No cervical adenopathy.  Skin:    General: Skin is warm and dry.     Capillary Refill: Capillary refill  takes less than 2 seconds.     Findings: No erythema or rash.  Neurological:     General: No focal deficit present.     Mental Status: He is alert and oriented to person, place, and time.  Psychiatric:        Mood and Affect: Mood normal.        Behavior: Behavior normal.        Thought Content: Thought content normal.        Judgment: Judgment normal.      UC Treatments / Results  Labs (all labs ordered are listed, but only abnormal results are displayed) Labs Reviewed - No data to display  EKG   Radiology No results found.  Procedures Procedures (including critical care time)  Medications Ordered in UC Medications - No data to display  Initial Impression / Assessment and Plan / UC Course  I have reviewed the triage vital signs and the nursing notes.  Pertinent labs & imaging results that were available during my care of the patient were reviewed by me and considered in my medical decision making (see chart for details).  Patient is a very pleasant, nontoxic-appearing 71 year old male here for evaluation of sinus issues that been going on for last 7 to 10 days to include left-sided sinus pressure with yellow-green nasal discharge that turned bloody yesterday.  For the last 5 to 6 days he has endorsed a sore throat and then he developed a nonproductive cough yesterday.  No shortness of breath or wheezing.  No ear pain or fever.  On exam patient has pearly-gray tympanic membranes bilaterally with normal light reflex and clear external auditory canals.  Nasal mucosa is erythematous and edematous with purulent bloody mixture discharge in the left nare.  He is also tender to percussion on the left maxillary frontal sinus.  The right is benign.  Oropharyngeal exam reveals mild posterior oropharyngeal erythema with yellow postnasal drip.  No cervical lymphadenopathy appreciated on exam.  Cardiopulmonary exam reveals clear lung sounds in all fields.  Patient's exam is consistent with sinusitis I will treat him with Augmentin twice daily for 10 days for treatment of his sinus infection.  I have encouraged him to continue to perform sinus irrigation to relieve the mucus burden, use over-the-counter Mucinex to help break up the stickiness of mucus, I have refilled his Astelin nasal spray to help with nasal congestion, and refilled his Tessalon  Perles and Promethazine DM cough syrup.  Cautions reviewed.   Final Clinical Impressions(s) / UC Diagnoses   Final diagnoses:  Acute non-recurrent maxillary sinusitis     Discharge Instructions      The Augmentin twice daily with food for 10 days for treatment of your sinusitis.  Perform sinus irrigation 2-3 times a day with a [employer] sinus rinse kit and distilled water.  Do not use tap water.  You can use plain over-the-counter Mucinex every 6 hours to break up the stickiness of the mucus so your body can clear it.  Increase your oral fluid intake to thin out your mucus so that is also able for your body to clear more easily.  Take an over-the-counter probiotic, such as Culturelle-align-activia, 1 hour after each dose of antibiotic to prevent diarrhea.  Use the tessalon perles during the day as needed for cough and the promethazine DM at bedtime.  If you develop any new or worsening symptoms return for reevaluation or see your primary care provider.      ED Prescriptions  Medication Sig Dispense Auth. Provider   amoxicillin-clavulanate (AUGMENTIN) 875-125 MG tablet  (Status: Discontinued) Take 1 tablet by mouth every 12 (twelve) hours for 10 days. 20 tablet Margarette Canada, NP   benzonatate (TESSALON) 100 MG capsule  (Status: Discontinued) Take 2 capsules (200 mg total) by mouth every 8 (eight) hours. 21 capsule Margarette Canada, NP   promethazine-dextromethorphan (PROMETHAZINE-DM) 6.25-15 MG/5ML syrup  (Status: Discontinued) Take 5 mLs by mouth 4 (four) times daily as needed. 118 mL Margarette Canada, NP   azelastine (ASTELIN) 0.1 % nasal spray Place 1 spray into both nostrils 2 (two) times daily. Use in each nostril as directed 30 mL Margarette Canada, NP   amoxicillin-clavulanate (AUGMENTIN) 875-125 MG tablet Take 1 tablet by mouth every 12 (twelve) hours for 10 days. 20 tablet Margarette Canada, NP   benzonatate (TESSALON) 100 MG capsule Take 2 capsules (200 mg total) by mouth every 8 (eight)  hours. 21 capsule Margarette Canada, NP   promethazine-dextromethorphan (PROMETHAZINE-DM) 6.25-15 MG/5ML syrup Take 5 mLs by mouth 4 (four) times daily as needed. 118 mL Margarette Canada, NP      PDMP not reviewed this encounter.   Margarette Canada, NP 01/16/22 1422

## 2022-01-16 NOTE — ED Triage Notes (Signed)
Pt reports sore throat x 5-6 days; nasal congestion and cough x 1 day.

## 2022-06-25 DIAGNOSIS — J4530 Mild persistent asthma, uncomplicated: Secondary | ICD-10-CM | POA: Insufficient documentation

## 2022-08-14 ENCOUNTER — Ambulatory Visit
Admission: RE | Admit: 2022-08-14 | Discharge: 2022-08-14 | Disposition: A | Discharge status: Home / Self Care | Payer: Medicare Other | Source: Ambulatory Visit | Attending: Internal Medicine | Admitting: Internal Medicine

## 2022-08-14 VITALS — BP 135/66 | HR 85 | Temp 98.8°F | Resp 12 | Ht 65.0 in | Wt 200.0 lb

## 2022-08-14 DIAGNOSIS — J069 Acute upper respiratory infection, unspecified: Secondary | ICD-10-CM

## 2022-08-14 MED ORDER — PROMETHAZINE-DM 6.25-15 MG/5ML PO SYRP: 5.0000 mL | ORAL_SOLUTION | Freq: Four times a day (QID) | ORAL | 0 refills | Status: DC | PRN

## 2022-08-14 MED ORDER — PROMETHAZINE-CODEINE 6.25-10 MG/5ML PO SYRP: 5.0000 mL | ORAL_SOLUTION | ORAL | 0 refills | Status: DC | PRN

## 2022-08-14 MED ORDER — AZITHROMYCIN 250 MG PO TABS: 250.0000 mg | ORAL_TABLET | Freq: Every day | ORAL | 0 refills | Status: DC

## 2022-08-14 NOTE — ED Provider Notes (Signed)
[facility]    CSN: [id number] Arrival date & time: 08/14/22  1043      History   Chief Complaint Chief Complaint  Patient presents with   Cough   Sore Throat   Nasal Congestion    HPI Peter Morales is a 71 y.o. male.  He has multiple medical problems including coronary disease, vascular disease, COPD.  He had a flu shot at [facility] on 9/16, when he went to pick up some medication refills.  That evening, he had the onset of sore throat, cough, congestion, tactile temp.  The cough is preventing him from sleeping at night, and he has not slept in the last couple of nights.  He has taken a home COVID test which was negative, and is not particularly interested in a flu or COVID test here.  He would like some symptom relief for the coughing.   Cough Sore Throat    Past Medical History:  Diagnosis Date   Abnormal vision    overhanging eyelids   Allergic rhinitis    Anxiety    Asthma    Coronary artery disease    Diabetes mellitus without complication (HCC)    Erectile dysfunction    GERD (gastroesophageal reflux disease)    Glaucoma    Hyperlipidemia    Hyperplastic colon polyp    Hypertension    Insomnia    Myocardial infarct (HCC)    Tubular adenoma of colon     Patient Active Problem List   Diagnosis Date Noted   NSTEMI (non-ST elevated myocardial infarction) ([place]) 11/06/2017   Unstable angina ([place]) 11/06/2017   Allergic rhinitis 09/09/2017   Erectile disorder due to medical condition in male patient 04/27/2016   Mixed dyslipidemia 02/01/2016   Controlled type 2 diabetes mellitus with circulatory disorder ([place]) 01/25/2016   Coronary artery disease involving native coronary artery of native heart with angina pectoris ([place]) 01/25/2016   Gastroesophageal reflux disease with esophagitis 01/25/2016   Hypertension, well controlled 01/25/2016   Insomnia, persistent 01/25/2016    Past Surgical History:  Procedure Laterality Date   CARDIAC CATHETERIZATION      CARDIAC SURGERY     COLONOSCOPY WITH PROPOFOL N/A 01/11/2017   Procedure: COLONOSCOPY WITH PROPOFOL;  Surgeon: Lollie Sails, MD;  Location: [facility] ENDOSCOPY;  Service: Endoscopy;  Laterality: N/A;   COLONOSCOPY WITH PROPOFOL N/A 06/02/2020   Procedure: COLONOSCOPY WITH PROPOFOL;  Surgeon: Toledo, Benay Pike, MD;  Location: [facility] ENDOSCOPY;  Service: Gastroenterology;  Laterality: N/A;   CORONARY ANGIOPLASTY     CORONARY ARTERY BYPASS GRAFT     ESOPHAGOGASTRODUODENOSCOPY (EGD) WITH PROPOFOL N/A 06/02/2020   Procedure: ESOPHAGOGASTRODUODENOSCOPY (EGD) WITH PROPOFOL;  Surgeon: Toledo, Benay Pike, MD;  Location: [facility] ENDOSCOPY;  Service: Gastroenterology;  Laterality: N/A;       Home Medications    Prior to Admission medications   Medication Sig Start Date End Date Taking? Authorizing Provider  acetaminophen (TYLENOL) 500 MG tablet Take 500 mg by mouth every 6 (six) hours as needed.   Yes [provider]  albuterol (PROVENTIL HFA;VENTOLIN HFA) 108 (90 Base) MCG/ACT inhaler Inhale 2 puffs into the lungs every 6 (six) hours as needed for wheezing or shortness of breath.   Yes [provider]  amLODipine (NORVASC) 2.5 MG tablet Take 2.5 mg by mouth daily.   Yes [provider]  aspirin EC 81 MG tablet Take 81 mg by mouth daily.   Yes [provider]  azelastine (OPTIVAR) 0.05 % ophthalmic solution Place into  both eyes 2 (two) times daily.   Yes [provider]  azithromycin (ZITHROMAX) 250 MG tablet Take 1 tablet (250 mg total) by mouth daily. Take first 2 tablets together, then 1 every day until finished. 08/14/22  Yes Wynona Luna, MD  carvedilol (COREG) 12.5 MG tablet Take 12.5 mg by mouth 2 (two) times daily with a meal.   Yes [provider]  Cholecalciferol (VITAMIN D3 PO) Take 1,000 Units by mouth.   Yes [provider]  clopidogrel (PLAVIX) 75 MG tablet Take 75 mg by mouth daily.   Yes [provider]   famotidine (PEPCID) 10 MG tablet Take 10 mg by mouth 2 (two) times daily.   Yes [provider]  fexofenadine (ALLEGRA) 180 MG tablet Take by mouth.   Yes [provider]  fluticasone (FLONASE) 50 MCG/ACT nasal spray Place into both nostrils daily.   Yes [provider]  glipiZIDE (GLUCOTROL) 10 MG tablet Take 10 mg by mouth daily before breakfast.   Yes [provider]  ketotifen (ZADITOR) 0.025 % ophthalmic solution Apply to eye. 09/09/17  Yes [provider]  lisinopril (PRINIVIL,ZESTRIL) 10 MG tablet Take 10 mg by mouth daily.   Yes [provider]  LORazepam (ATIVAN) 0.5 MG tablet Take 0.5 mg by mouth every 8 (eight) hours. takr 1/2 tab as needed   Yes [provider]  metFORMIN (GLUMETZA) 1000 MG (MOD) 24 hr tablet Take 1,000 mg by mouth 2 (two) times daily with a meal.   Yes [provider]  nitroGLYCERIN (NITROSTAT) 0.4 MG SL tablet Place 0.4 mg under the tongue every 5 (five) minutes as needed for chest pain.   Yes [provider]  pantoprazole (PROTONIX) 40 MG tablet Take 40 mg by mouth daily.   Yes [provider]  promethazine-dextromethorphan (PROMETHAZINE-DM) 6.25-15 MG/5ML syrup Take 5 mLs by mouth 4 (four) times daily as needed for cough. 08/14/22  Yes Wynona Luna, MD  spironolactone (ALDACTONE) 25 MG tablet Take 25 mg by mouth daily.   Yes [provider]  isosorbide mononitrate (IMDUR) 30 MG 24 hr tablet Take by mouth. 03/18/17 10/17/18  [provider]  montelukast (SINGULAIR) 10 MG tablet Take by mouth. 09/09/17 09/09/18  [provider]  rosuvastatin (CRESTOR) 40 MG tablet Take by mouth. 11/13/17 11/13/18  [provider]    Family History Family History  Problem Relation Age of Onset   Arthritis Mother    Heart attack Father    Arthritis Father    Prostate cancer Brother    Coronary artery disease Brother     Social History Social  History   Tobacco Use   Smoking status: Former    Types: Cigarettes    Quit date: 07/23/1983    Years since quitting: 39.0   Smokeless tobacco: Never  Vaping Use   Vaping Use: Never used  Substance Use Topics   Alcohol use: Not Currently    Comment: occasional    Drug use: No     Allergies   Patient has no known allergies.   Review of Systems Review of Systems  Respiratory:  Positive for cough.   See also HPI   Physical Exam Triage Vital Signs ED Triage Vitals  Enc Vitals Group     BP 08/14/22 1107 135/66     Pulse Rate 08/14/22 1107 85     Resp 08/14/22 1107 12     Temp 08/14/22 1107 98.8 F (37.1 C)  Temp Source 08/14/22 1107 Oral     SpO2 08/14/22 1107 98 %     Weight 08/14/22 1101 200 lb (90.7 kg)     Height 08/14/22 1101 '5\' 5"'$  (1.651 m)     Pain Score 08/14/22 1100 9     Pain Loc --     Updated Vital Signs BP 135/66 (BP Location: Left Arm)   Pulse 85   Temp 98.8 F (37.1 C) (Oral)   Resp 12   Ht '5\' 5"'$  (1.651 m)   Wt 90.7 kg   SpO2 98%   BMI 33.28 kg/m   Physical Exam Constitutional:      General: He is not in acute distress.    Appearance: He is ill-appearing. He is not toxic-appearing.     Comments: Good hygiene  HENT:     Head: Atraumatic.     Comments: Bilateral TMs are moderately dull, no erythema, small amount of waxy debris present Moderately severe nasal congestion bilaterally Posterior pharynx is moderately injected     Mouth/Throat:     Mouth: Mucous membranes are moist.  Eyes:     Conjunctiva/sclera:     Right eye: Right conjunctiva is not injected. No exudate.    Left eye: Left conjunctiva is not injected. No exudate.    Comments: Conjugate gaze observed  Cardiovascular:     Rate and Rhythm: Normal rate and regular rhythm.  Pulmonary:     Effort: Pulmonary effort is normal. No respiratory distress.     Breath sounds: No wheezing or rhonchi.  Abdominal:     Comments: Protuberant  Musculoskeletal:     Cervical back:  Neck supple.     Comments: Walked into the urgent care independently, uses a cane on the left Has nocturnal leg discomfort and a little bit of leg edema, left greater than right, which he recently had ABIs/ultrasound for  Skin:    General: Skin is warm and dry.     Comments: No cyanosis  Neurological:     Mental Status: He is alert.     Comments: Face is symmetric, speech is clear, coherent, logical      UC Treatments / Results  Labs (all labs ordered are listed, but only abnormal results are displayed) Labs Reviewed - No data to display NA  EKG NA  Radiology No results found. NA  Procedures Procedures (including critical care time) NA  Medications Ordered in UC Medications - No data to display NA  Final Clinical Impressions(s) / UC Diagnoses   Final diagnoses:  Acute upper respiratory infection     Discharge Instructions      Symptoms and exam today are consistent with a viral respiratory infection.  Prescription for azithromycin (antibiotic) was sent to the pharmacy, because of comorbidities, including COPD.  Prescription for cough syrup was also sent to the pharmacy; a substitution for the cough syrup was made after discharge, based on what the pharmacy had in stock. Push fluids and rest.  Take tylenol or advil otc as needed for fever, discomfort.  Eat fruits and vegetables to help your immune system do its best work.  Anticipate gradual improvement over the next several days.  Recheck for new fever >100.5, increasing phlegm production/nasal discharge, or if not starting to improve in a few days.        ED Prescriptions     Medication Sig Dispense Auth. Provider   azithromycin (ZITHROMAX) 250 MG tablet Take 1 tablet (250 mg total) by mouth daily. Take first 2 tablets  together, then 1 every day until finished. 6 tablet Wynona Luna, MD   promethazine-codeine [facility] WITH CODEINE) 6.25-10 MG/5ML syrup  (Status: Discontinued) Take 5 mLs by mouth every  4 (four) hours as needed for cough. 120 mL Wynona Luna, MD   promethazine-dextromethorphan (PROMETHAZINE-DM) 6.25-15 MG/5ML syrup Take 5 mLs by mouth 4 (four) times daily as needed for cough. 118 mL Wynona Luna, MD      I have reviewed the PDMP during this encounter.   Wynona Luna, MD 08/16/22 2134

## 2022-08-14 NOTE — ED Triage Notes (Addendum)
Pt states that he received his flu vaccine at [facility] on Saturday. Pt states symptoms started on Saturday night.  Pt c/o sore throat, cough, nasal congestion x3days.  Pt was instructed to see a doctor if symptoms occur after vaccination.   Pt took a covid test and it was negative.  Pt asks for Cough medication to be sent in.  Pt declines a covid test as he states that he has many at home and will take home tests.

## 2022-08-14 NOTE — Discharge Instructions (Signed)
Symptoms and exam today are consistent with a viral respiratory infection.  Prescription for azithromycin (antibiotic) was sent to the pharmacy, because of comorbidities, including COPD.  Prescription for cough syrup was also sent to the pharmacy; a substitution for the cough syrup was made after discharge, based on what the pharmacy had in stock. Push fluids and rest.  Take tylenol or advil otc as needed for fever, discomfort.  Eat fruits and vegetables to help your immune system do its best work.  Anticipate gradual improvement over the next several days.  Recheck for new fever >100.5, increasing phlegm production/nasal discharge, or if not starting to improve in a few days.

## 2023-01-03 DIAGNOSIS — R252 Cramp and spasm: Secondary | ICD-10-CM | POA: Insufficient documentation

## 2023-03-05 DIAGNOSIS — N522 Drug-induced erectile dysfunction: Secondary | ICD-10-CM | POA: Insufficient documentation

## 2024-02-18 ENCOUNTER — Ambulatory Visit (INDEPENDENT_AMBULATORY_CARE_PROVIDER_SITE_OTHER)

## 2024-02-18 ENCOUNTER — Ambulatory Visit: Admission: EM | Admit: 2024-02-18 | Discharge: 2024-02-18 | Disposition: A | Discharge status: Home / Self Care

## 2024-02-18 DIAGNOSIS — S80812A Abrasion, left lower leg, initial encounter: Secondary | ICD-10-CM | POA: Diagnosis not present

## 2024-02-18 DIAGNOSIS — M79662 Pain in left lower leg: Secondary | ICD-10-CM

## 2024-02-18 DIAGNOSIS — I10 Essential (primary) hypertension: Secondary | ICD-10-CM

## 2024-02-18 DIAGNOSIS — S0990XA Unspecified injury of head, initial encounter: Secondary | ICD-10-CM

## 2024-02-18 DIAGNOSIS — L03116 Cellulitis of left lower limb: Secondary | ICD-10-CM | POA: Diagnosis not present

## 2024-02-18 DIAGNOSIS — Z7901 Long term (current) use of anticoagulants: Secondary | ICD-10-CM

## 2024-02-18 MED ORDER — MUPIROCIN 2 % EX OINT: 1.0000 | TOPICAL_OINTMENT | Freq: Two times a day (BID) | CUTANEOUS | 0 refills | Status: DC

## 2024-02-18 MED ORDER — CEPHALEXIN 500 MG PO CAPS: 1000.0000 mg | ORAL_CAPSULE | Freq: Two times a day (BID) | ORAL | 0 refills | Status: AC

## 2024-02-18 NOTE — Discharge Instructions (Addendum)
-  X-ray looks normal.  I do not see any fractures.  I will call you with radiology to see something different. - I sent antibiotic to pharmacy to cover for infection of the wound. - Clean the area with soap and water and apply the ointment 2-3 times a day - Elevate and apply ice to extremity to help with swelling.  May take Tylenol as needed for pain relief.  Hopefully the discomfort improves after suspected infection is treated in the next few days. - If worsening swelling, worsening pain, fever please go to the ER. - In regards to the head injury, appears to be minor and you already 3 days out.  However, if you have any severe headaches, vision changes, dizziness, weakness, numbness/tingling, vomiting, etc. please call 911 or go to the ER.

## 2024-02-18 NOTE — ED Triage Notes (Addendum)
 Patient states that he was pulling weeds Sat. He stood up from bending and tripped over the boarder of the flower bed. Patient states that he hurt his left shin. Patient states that area keeps bleeding he's on plavix. Patient states that the leg hurts when he tries to move it. Patient states that he also hit his forehead and bridge of nose on the grass.

## 2024-02-18 NOTE — ED Provider Notes (Signed)
 [facility]    CSN: [id number] Arrival date & time: 02/18/24  0906      History   Chief Complaint Chief Complaint  Patient presents with   Fall   Leg Injury    HPI Ovid Witman is a 73 y.o. male with history of asthma, anxiety, CAD, previous MI, diabetes, hypertension, hyperlipidemia, insomnia, GERD, and long term use of Plavix.  Patient presents today for evaluation of abrasion of left anterior lower leg with associated swelling and redness.  Patient reports he was bending over a flower bed a couple of days ago and fell forward.  Reports he hit the shin and scraped it when he was coming back up.  He also reports hitting his forehead and nose.  Denies loss of consciousness.  Patient reports over the past couple days the shin discomfort has gotten worse.  He has had intermittent bleeding of the abrasion.  Denies pustular drainage.  Reports he is able to walk and bear weight without much difficulty.  Reports most discomfort when he is rolling over in bed.  Denies any red flag signs or symptoms related to head injury.  No headaches, nausea/vomiting, dizziness, confusion, vision changes, gait instability, tremors, numbness/tingling or weakness.  Last Tdap 6 years ago per patient. Declines updating that today.    HPI  Past Medical History:  Diagnosis Date   Abnormal vision    overhanging eyelids   Allergic rhinitis    Anxiety    Asthma    Coronary artery disease    Diabetes mellitus without complication (HCC)    Erectile dysfunction    GERD (gastroesophageal reflux disease)    Glaucoma    Hyperlipidemia    Hyperplastic colon polyp    Hypertension    Insomnia    Myocardial infarct (HCC)    Tubular adenoma of colon     Patient Active Problem List   Diagnosis Date Noted   Drug-induced erectile dysfunction 03/05/2023   Hypomagnesemia 01/03/2023   Muscle cramping 01/03/2023   Severe obesity (BMI 35.0-39.9) with comorbidity (HCC) 11/01/2022   Mild persistent  asthma without complication 06/25/2022   Advance directive discussed with patient 08/04/2021   Back pain with sciatica 05/04/2021   Chronic diastolic CHF (congestive heart failure) (HCC) 02/22/2020   Colon polyps 01/08/2020   Chronic pain of both shoulders 09/15/2019   Patellofemoral disorder of left knee 09/15/2019   Nodule of palm, bilateral 04/15/2019   Numbness and tingling in right hand 04/15/2019   Polyarthralgia 04/15/2019   Mild chronic obstructive pulmonary disease (HCC) 02/10/2018   Shortness of breath 12/14/2017   NSTEMI (non-ST elevated myocardial infarction) (HCC) 11/06/2017   Unstable angina (HCC) 11/06/2017   Allergic rhinitis 09/09/2017   Seasonal asthma 09/09/2017   Chronic allergic rhinitis 09/09/2017   Abnormal vision 08/06/2016   Erectile disorder due to medical condition in male patient 04/27/2016   Mixed dyslipidemia 02/01/2016   Controlled type 2 diabetes mellitus with circulatory disorder (HCC) 01/25/2016   Coronary artery disease involving native coronary artery of native heart with angina pectoris (HCC) 01/25/2016   Gastroesophageal reflux disease with esophagitis 01/25/2016   Hypertension, well controlled 01/25/2016   Insomnia, persistent 01/25/2016   Essential hypertension 01/25/2016   Type 2 diabetes mellitus with circulatory disorder, without long-term current use of insulin (HCC) 01/25/2016    Past Surgical History:  Procedure Laterality Date   CARDIAC CATHETERIZATION     CARDIAC SURGERY     COLONOSCOPY WITH PROPOFOL N/A 01/11/2017   Procedure: COLONOSCOPY WITH  PROPOFOL;  Surgeon: Christena Deem, MD;  Location: [facility] ENDOSCOPY;  Service: Endoscopy;  Laterality: N/A;   COLONOSCOPY WITH PROPOFOL N/A 06/02/2020   Procedure: COLONOSCOPY WITH PROPOFOL;  Surgeon: Toledo, Boykin Nearing, MD;  Location: [facility] ENDOSCOPY;  Service: Gastroenterology;  Laterality: N/A;   CORONARY ANGIOPLASTY     CORONARY ARTERY BYPASS GRAFT     ESOPHAGOGASTRODUODENOSCOPY (EGD) WITH  PROPOFOL N/A 06/02/2020   Procedure: ESOPHAGOGASTRODUODENOSCOPY (EGD) WITH PROPOFOL;  Surgeon: Toledo, Boykin Nearing, MD;  Location: [facility] ENDOSCOPY;  Service: Gastroenterology;  Laterality: N/A;       Home Medications    Prior to Admission medications   Medication Sig Start Date End Date Taking? Authorizing Provider  amLODipine (NORVASC) 2.5 MG tablet Take 2.5 mg by mouth daily.   Yes [provider]  aspirin EC 81 MG tablet Take 81 mg by mouth daily.   Yes [provider]  carvedilol (COREG) 12.5 MG tablet Take 12.5 mg by mouth 2 (two) times daily with a meal.   Yes [provider]  cephALEXin (KEFLEX) 500 MG capsule Take 2 capsules (1,000 mg total) by mouth 2 (two) times daily for 7 days. 02/18/24 02/25/24 Yes Shirlee Latch, PA-C  clopidogrel (PLAVIX) 75 MG tablet Take 75 mg by mouth daily.   Yes [provider]  empagliflozin (JARDIANCE) 25 MG TABS tablet Take 1 tablet by mouth daily. 12/20/23  Yes [provider]  glipiZIDE (GLUCOTROL) 10 MG tablet Take 10 mg by mouth daily before breakfast.   Yes [provider]  losartan (COZAAR) 100 MG tablet Take 100 mg by mouth daily.   Yes [provider]  metFORMIN (GLUMETZA) 1000 MG (MOD) 24 hr tablet Take 1,000 mg by mouth 2 (two) times daily with a meal.   Yes [provider]  mupirocin ointment (BACTROBAN) 2 % Apply 1 Application topically 2 (two) times daily. 02/18/24  Yes Shirlee Latch, PA-C  spironolactone (ALDACTONE) 25 MG tablet Take 25 mg by mouth daily.   Yes [provider]  acetaminophen (TYLENOL) 500 MG tablet Take 500 mg by mouth every 6 (six) hours as needed.    [provider]  albuterol (PROVENTIL HFA;VENTOLIN HFA) 108 (90 Base) MCG/ACT inhaler Inhale 2 puffs into the lungs every 6 (six) hours as needed for wheezing or shortness of breath.    [provider]  azelastine (OPTIVAR) 0.05 % ophthalmic solution Place into both eyes 2 (two) times  daily.    [provider]  azithromycin (ZITHROMAX) 250 MG tablet Take 1 tablet (250 mg total) by mouth daily. Take first 2 tablets together, then 1 every day until finished. 08/14/22   Isa Rankin, MD  Cholecalciferol (VITAMIN D3 PO) Take 1,000 Units by mouth.    [provider]  famotidine (PEPCID) 10 MG tablet Take 10 mg by mouth 2 (two) times daily.    [provider]  fexofenadine (ALLEGRA) 180 MG tablet Take by mouth.    [provider]  fluticasone (FLONASE) 50 MCG/ACT nasal spray Place into both nostrils daily.    [provider]  isosorbide mononitrate (IMDUR) 30 MG 24 hr tablet Take by mouth. 03/18/17 10/17/18  [provider]  ketotifen (ZADITOR) 0.025 % ophthalmic solution Apply to eye. 09/09/17   [provider]  lisinopril (PRINIVIL,ZESTRIL) 10 MG tablet Take 10 mg by mouth daily.    [provider]  LORazepam (ATIVAN) 0.5 MG tablet Take 0.5 mg by mouth every 8 (eight) hours. takr 1/2 tab as  needed    [provider]  montelukast (SINGULAIR) 10 MG tablet Take by mouth. 09/09/17 09/09/18  [provider]  nitroGLYCERIN (NITROSTAT) 0.4 MG SL tablet Place 0.4 mg under the tongue every 5 (five) minutes as needed for chest pain.    [provider]  pantoprazole (PROTONIX) 40 MG tablet Take 40 mg by mouth daily.    [provider]  promethazine-dextromethorphan (PROMETHAZINE-DM) 6.25-15 MG/5ML syrup Take 5 mLs by mouth 4 (four) times daily as needed for cough. 08/14/22   Isa Rankin, MD  rosuvastatin (CRESTOR) 40 MG tablet Take by mouth. 11/13/17 11/13/18  [provider]    Family History Family History  Problem Relation Age of Onset   Arthritis Mother    Heart attack Father    Arthritis Father    Prostate cancer Brother    Coronary artery disease Brother     Social History Social History   Tobacco Use   Smoking status: Former    Current  packs/day: 0.00    Types: Cigarettes    Quit date: 07/23/1983    Years since quitting: 40.6   Smokeless tobacco: Never  Vaping Use   Vaping status: Never Used  Substance Use Topics   Alcohol use: Not Currently    Comment: occasional    Drug use: No     Allergies   Patient has no known allergies.   Review of Systems Review of Systems  Constitutional:  Negative for fatigue and fever.  Eyes:  Negative for visual disturbance.  Cardiovascular:  Positive for leg swelling.  Gastrointestinal:  Negative for nausea and vomiting.  Musculoskeletal:  Positive for arthralgias and joint swelling. Negative for gait problem.  Skin:  Positive for color change and wound. Negative for rash.  Neurological:  Negative for dizziness, syncope, weakness, numbness and headaches.  Psychiatric/Behavioral:  Negative for confusion.      Physical Exam Triage Vital Signs ED Triage Vitals  Encounter Vitals Group     BP      Systolic BP Percentile      Diastolic BP Percentile      Pulse      Resp      Temp      Temp src      SpO2      Weight      Height      Head Circumference      Peak Flow      Pain Score      Pain Loc      Pain Education      Exclude from Growth Chart    No data found.  Updated Vital Signs BP (!) 143/83 (BP Location: Left Arm)   Pulse 72   Temp 98.5 F (36.9 C) (Oral)   Resp 15   SpO2 95%      Physical Exam Vitals and nursing note reviewed.  Constitutional:      General: He is not in acute distress.    Appearance: Normal appearance. He is well-developed. He is not ill-appearing.  HENT:     Head:     Comments: Faint contusion between brows and bridge of nose.    Mouth/Throat:     Mouth: Mucous membranes are moist.     Pharynx: Oropharynx is clear.  Eyes:     General: No scleral icterus.    Extraocular Movements: Extraocular movements intact.     Conjunctiva/sclera: Conjunctivae normal.     Pupils: Pupils are equal, round, and reactive to light.  Cardiovascular:     Rate and Rhythm: Normal rate and regular rhythm.  Pulmonary:     Effort: Pulmonary effort is normal. No respiratory distress.     Breath sounds: Normal breath sounds.  Musculoskeletal:     Cervical back: Neck supple.  Skin:    General: Skin is warm and dry.     Capillary Refill: Capillary refill takes less than 2 seconds.     Findings: Erythema present.     Comments: Large superficial abrasio of left anterior lower leg with surrounding erythema and petechial rash. TTP/swelling along anterior tibia.  Neurological:     General: No focal deficit present.     Mental Status: He is alert and oriented to person, place, and time. Mental status is at baseline.     Cranial Nerves: No cranial nerve deficit.     Motor: No weakness.     Coordination: Coordination normal.     Gait: Gait abnormal (walks with a cane).  Psychiatric:        Mood and Affect: Mood normal.        Behavior: Behavior normal.      UC Treatments / Results  Labs (all labs ordered are listed, but only abnormal results are displayed) Labs Reviewed - No data to display  EKG   Radiology DG Tibia/Fibula Left Result Date: 02/18/2024 CLINICAL DATA:  Left shin injury after fall. EXAM: LEFT TIBIA AND FIBULA - 2 VIEW COMPARISON:  None Available. FINDINGS: There is no evidence of fracture or other focal bone lesions. Soft tissues are unremarkable. IMPRESSION: Negative. Electronically Signed   By: Lupita Raider M.D.   On: 02/18/2024 12:42    Procedures Procedures (including critical care time)  Medications Ordered in UC Medications - No data to display  Initial Impression / Assessment and Plan / UC Course  I have reviewed the triage vital signs and the nursing notes.  Pertinent labs & imaging results that were available during my care of the patient were reviewed by me and considered in my medical decision making (see chart for details).   73 year old male with history of long-term anticoagulant  use/Plavix presents for minor head injury without loss of consciousness and left lower leg injury/abrasion that occurred 3 days ago when he fell forward into his flower bed.  No red flag signs or symptoms in regards to the head injury.  BP elevated mildly at 143/83.  Patient takes amlodipine, losartan and carvedilol.  Advised to continue taking blood pressure medication and keep a log.  Continue to follow with cardiology.  On evaluation he has swelling and tenderness of the anterior left shin with wound and petechial rash/erythema.  Possibly early cellulitis.  Will obtain x-ray to evaluate for potential underlying fracture.    Normal neuroexam.  Thoroughly reviewed red flag signs and symptoms related to head injury advised patient to go to the ED if any of that occurs.  He is 3 days out from injury so very low suspicion of intracranial bleed.Cathlean Sauer of left lower leg negative.  Reviewed with patient.  Will treat at this time for early cellulitis with Keflex.  Also sent mupirocin ointment and discussed wound care guidelines with him.  Thoroughly reviewed return and ER precautions.   Final Clinical Impressions(s) / UC Diagnoses   Final diagnoses:  Pain in left shin  Cellulitis of left anterior lower leg  Abrasion of left lower leg, initial encounter  Minor head injury, initial encounter  Long term current use of anticoagulant  therapy  Essential hypertension     Discharge Instructions      -X-ray looks normal.  I do not see any fractures.  I will call you with radiology to see something different. - I sent antibiotic to pharmacy to cover for infection of the wound. - Clean the area with soap and water and apply the ointment 2-3 times a day - Elevate and apply ice to extremity to help with swelling.  May take Tylenol as needed for pain relief.  Hopefully the discomfort improves after suspected infection is treated in the next few days. - If worsening swelling, worsening pain, fever please  go to the ER. - In regards to the head injury, appears to be minor and you already 3 days out.  However, if you have any severe headaches, vision changes, dizziness, weakness, numbness/tingling, vomiting, etc. please call 911 or go to the ER.     ED Prescriptions     Medication Sig Dispense Auth. Provider   cephALEXin (KEFLEX) 500 MG capsule Take 2 capsules (1,000 mg total) by mouth 2 (two) times daily for 7 days. 28 capsule Eusebio Friendly B, PA-C   mupirocin ointment (BACTROBAN) 2 % Apply 1 Application topically 2 (two) times daily. 22 g Shirlee Latch, PA-C      PDMP not reviewed this encounter.   Shirlee Latch, PA-C 02/18/24 1321

## 2024-05-31 ENCOUNTER — Ambulatory Visit (INDEPENDENT_AMBULATORY_CARE_PROVIDER_SITE_OTHER)

## 2024-05-31 ENCOUNTER — Ambulatory Visit
Admission: EM | Admit: 2024-05-31 | Discharge: 2024-05-31 | Disposition: A | Discharge status: Home / Self Care | Attending: Physician Assistant | Admitting: Physician Assistant

## 2024-05-31 ENCOUNTER — Encounter: Payer: Self-pay | Admitting: Emergency Medicine

## 2024-05-31 DIAGNOSIS — J441 Chronic obstructive pulmonary disease with (acute) exacerbation: Secondary | ICD-10-CM

## 2024-05-31 DIAGNOSIS — J209 Acute bronchitis, unspecified: Secondary | ICD-10-CM

## 2024-05-31 DIAGNOSIS — R051 Acute cough: Secondary | ICD-10-CM | POA: Diagnosis not present

## 2024-05-31 MED ORDER — DOXYCYCLINE HYCLATE 100 MG PO CAPS: 100.0000 mg | ORAL_CAPSULE | Freq: Two times a day (BID) | ORAL | 0 refills | Status: AC

## 2024-05-31 MED ORDER — PREDNISONE 20 MG PO TABS: 40.0000 mg | ORAL_TABLET | Freq: Every day | ORAL | 0 refills | Status: AC

## 2024-05-31 MED ORDER — PROMETHAZINE-DM 6.25-15 MG/5ML PO SYRP: 5.0000 mL | ORAL_SOLUTION | Freq: Every evening | ORAL | 0 refills | Status: AC | PRN

## 2024-05-31 NOTE — Discharge Instructions (Signed)
-  You have bronchitis. This often can last for weeks. -I will call if radiologist sees any abnormality like pneumonia, but I did not.  -Start doxycycline  antibiotics.  -Start prednisone  corticosteroid -Use ipratropium/albuterol nebulizer a couple times a day as long as you are ill.  -Increase rest and fluids -Take Mucinex during the day and Promethazine  DM at night as needed for cough. -Return if fever or worsening symptoms.  -Go to ER for worsening shortness of breath or increased chest pain or weakness.

## 2024-05-31 NOTE — ED Triage Notes (Signed)
 Patient c/o cough and chest congestion for 10 days.  Patient reports increase in thick sputum.  Patient states that his cough is worse at night.  Patient unsure of fevers.  Patient does use an inhaler when he has URI.

## 2024-05-31 NOTE — ED Provider Notes (Signed)
 [facility]    CSN: [id number] Arrival date & time: 05/31/24  0916      History   Chief Complaint Chief Complaint  Patient presents with   Cough    HPI Peter Morales is a 73 y.o. male presenting with his daughter for evaluation of cough and congestion for 10 days.  Cough was initially dry but has now become productive with yellowish-green sputum which is thick.  He reports increased wheezing and shortness of breath from baseline.  History of asthma and stage II moderate COPD.  Uses albuterol as needed.  Denies any fevers.  Reports pain in his chest with coughing.  Also reports having a coughing fits, mostly at night that caused him to feel more short of breath and dizzy.  His wife is sick as well but is feeling better.  He has been taking Mucinex.  Patient reports having negative COVID test onset.  Patient's other medical history significant for allergies, type 2 diabetes, coronary artery disease, GERD, hypertension, insomnia, previous MI, CHF, obesity, allergies.  HPI  Past Medical History:  Diagnosis Date   Abnormal vision    overhanging eyelids   Allergic rhinitis    Anxiety    Asthma    Coronary artery disease    Diabetes mellitus without complication (HCC)    Erectile dysfunction    GERD (gastroesophageal reflux disease)    Glaucoma    Hyperlipidemia    Hyperplastic colon polyp    Hypertension    Insomnia    Myocardial infarct (HCC)    Tubular adenoma of colon     Patient Active Problem List   Diagnosis Date Noted   Drug-induced erectile dysfunction 03/05/2023   Hypomagnesemia 01/03/2023   Muscle cramping 01/03/2023   Severe obesity (BMI 35.0-39.9) with comorbidity (HCC) 11/01/2022   Mild persistent asthma without complication 06/25/2022   Advance directive discussed with patient 08/04/2021   Back pain with sciatica 05/04/2021   Chronic diastolic CHF (congestive heart failure) (HCC) 02/22/2020   Colon polyps 01/08/2020   Chronic pain of both  shoulders 09/15/2019   Patellofemoral disorder of left knee 09/15/2019   Nodule of palm, bilateral 04/15/2019   Numbness and tingling in right hand 04/15/2019   Polyarthralgia 04/15/2019   Mild chronic obstructive pulmonary disease (HCC) 02/10/2018   Shortness of breath 12/14/2017   NSTEMI (non-ST elevated myocardial infarction) (HCC) 11/06/2017   Unstable angina (HCC) 11/06/2017   Allergic rhinitis 09/09/2017   Seasonal asthma 09/09/2017   Chronic allergic rhinitis 09/09/2017   Abnormal vision 08/06/2016   Erectile disorder due to medical condition in male patient 04/27/2016   Mixed dyslipidemia 02/01/2016   Controlled type 2 diabetes mellitus with circulatory disorder (HCC) 01/25/2016   Coronary artery disease involving native coronary artery of native heart with angina pectoris (HCC) 01/25/2016   Gastroesophageal reflux disease with esophagitis 01/25/2016   Hypertension, well controlled 01/25/2016   Insomnia, persistent 01/25/2016   Essential hypertension 01/25/2016   Type 2 diabetes mellitus with circulatory disorder, without long-term current use of insulin (HCC) 01/25/2016    Past Surgical History:  Procedure Laterality Date   CARDIAC CATHETERIZATION     CARDIAC SURGERY     COLONOSCOPY WITH PROPOFOL  N/A 01/11/2017   Procedure: COLONOSCOPY WITH PROPOFOL ;  Surgeon: Gladis RAYMOND Mariner, MD;  Location: [facility] ENDOSCOPY;  Service: Endoscopy;  Laterality: N/A;   COLONOSCOPY WITH PROPOFOL  N/A 06/02/2020   Procedure: COLONOSCOPY WITH PROPOFOL ;  Surgeon: Toledo, Ladell POUR, MD;  Location: [facility] ENDOSCOPY;  Service: Gastroenterology;  Laterality:  N/A;   CORONARY ANGIOPLASTY     CORONARY ARTERY BYPASS GRAFT     ESOPHAGOGASTRODUODENOSCOPY (EGD) WITH PROPOFOL  N/A 06/02/2020   Procedure: ESOPHAGOGASTRODUODENOSCOPY (EGD) WITH PROPOFOL ;  Surgeon: Toledo, Ladell POUR, MD;  Location: [facility] ENDOSCOPY;  Service: Gastroenterology;  Laterality: N/A;       Home Medications    Prior to Admission  medications   Medication Sig Start Date End Date Taking? Authorizing Provider  doxycycline  (VIBRAMYCIN ) 100 MG capsule Take 1 capsule (100 mg total) by mouth 2 (two) times daily for 7 days. 05/31/24 06/07/24 Yes Arvis Huxley B, PA-C  predniSONE  (DELTASONE ) 20 MG tablet Take 2 tablets (40 mg total) by mouth daily for 5 days. 05/31/24 06/05/24 Yes Arvis Huxley NOVAK, PA-C  promethazine -dextromethorphan (PROMETHAZINE -DM) 6.25-15 MG/5ML syrup Take 5 mLs by mouth at bedtime as needed for cough. 05/31/24  Yes Arvis Huxley NOVAK, PA-C  acetaminophen  (TYLENOL ) 500 MG tablet Take 500 mg by mouth every 6 (six) hours as needed.    [provider]  albuterol (PROVENTIL HFA;VENTOLIN HFA) 108 (90 Base) MCG/ACT inhaler Inhale 2 puffs into the lungs every 6 (six) hours as needed for wheezing or shortness of breath.    [provider]  amLODipine (NORVASC) 2.5 MG tablet Take 2.5 mg by mouth daily.    [provider]  aspirin EC 81 MG tablet Take 81 mg by mouth daily.    [provider]  azelastine  (OPTIVAR ) 0.05 % ophthalmic solution Place into both eyes 2 (two) times daily.    [provider]  carvedilol (COREG) 12.5 MG tablet Take 12.5 mg by mouth 2 (two) times daily with a meal.    [provider]  Cholecalciferol (VITAMIN D3 PO) Take 1,000 Units by mouth.    [provider]  clopidogrel (PLAVIX) 75 MG tablet Take 75 mg by mouth daily.    [provider]  empagliflozin (JARDIANCE) 25 MG TABS tablet Take 1 tablet by mouth daily. 12/20/23   [provider]  famotidine (PEPCID) 10 MG tablet Take 10 mg by mouth 2 (two) times daily.    [provider]  fexofenadine (ALLEGRA) 180 MG tablet Take by mouth.    [provider]  fluticasone (FLONASE) 50 MCG/ACT nasal spray Place into both nostrils daily.    [provider]  glipiZIDE (GLUCOTROL) 10 MG tablet Take 10 mg by mouth daily before breakfast.    [provider]   isosorbide mononitrate (IMDUR) 30 MG 24 hr tablet Take by mouth. 03/18/17 10/17/18  [provider]  ketotifen (ZADITOR) 0.025 % ophthalmic solution Apply to eye. 09/09/17   [provider]  lisinopril (PRINIVIL,ZESTRIL) 10 MG tablet Take 10 mg by mouth daily.    [provider]  LORazepam (ATIVAN) 0.5 MG tablet Take 0.5 mg by mouth every 8 (eight) hours. takr 1/2 tab as needed    [provider]  losartan (COZAAR) 100 MG tablet Take 100 mg by mouth daily.    [provider]  metFORMIN (GLUMETZA) 1000 MG (MOD) 24 hr tablet Take 1,000 mg by mouth 2 (two) times daily with a meal.    [provider]  montelukast (SINGULAIR) 10 MG tablet Take by mouth. 09/09/17 09/09/18  [provider]  nitroGLYCERIN (NITROSTAT) 0.4 MG SL tablet Place 0.4 mg under the tongue every 5 (five) minutes as needed for chest pain.    [provider]  pantoprazole (PROTONIX) 40 MG tablet Take 40 mg by mouth daily.    [provider]  rosuvastatin (CRESTOR) 40 MG tablet Take by mouth. 11/13/17 11/13/18  [provider]  spironolactone (ALDACTONE) 25 MG tablet Take 25 mg by mouth daily.    [provider]    Family History Family History  Problem Relation Age of Onset   Arthritis Mother    Heart attack Father    Arthritis Father    Prostate cancer Brother    Coronary artery disease Brother     Social History Social History   Tobacco Use   Smoking status: Former    Current packs/day: 0.00    Types: Cigarettes    Quit date: 07/23/1983    Years since quitting: 40.8   Smokeless tobacco: Never  Vaping Use   Vaping status: Never Used  Substance Use Topics   Alcohol use: Not Currently    Comment: occasional    Drug use: No     Allergies   Statins   Review of Systems Review of Systems  Constitutional:  Positive for fatigue. Negative for fever.  HENT:  Positive for congestion and rhinorrhea. Negative for ear  pain, sinus pressure, sinus pain and sore throat.   Respiratory:  Positive for cough, shortness of breath and wheezing.   Cardiovascular:  Positive for chest pain.  Gastrointestinal:  Negative for abdominal pain, diarrhea, nausea and vomiting.  Musculoskeletal:  Negative for myalgias.  Neurological:  Negative for weakness, light-headedness and headaches.  Hematological:  Negative for adenopathy.     Physical Exam Triage Vital Signs ED Triage Vitals  Encounter Vitals Group     BP 05/31/24 0958 (!) 147/73     Girls Systolic BP Percentile --      Girls Diastolic BP Percentile --      Boys Systolic BP Percentile --      Boys Diastolic BP Percentile --      Pulse Rate 05/31/24 0958 73     Resp 05/31/24 0958 15     Temp 05/31/24 0958 98.1 F (36.7 C)     Temp Source 05/31/24 0958 Oral     SpO2 05/31/24 0958 98 %     Weight 05/31/24 0956 199 lb 15.3 oz (90.7 kg)     Height 05/31/24 0956 5' 5 (1.651 m)     Head Circumference --      Peak Flow --      Pain Score 05/31/24 0955 0     Pain Loc --      Pain Education --      Exclude from Growth Chart --    No data found.  Updated Vital Signs BP (!) 147/73 (BP Location: Left Arm)   Pulse 73   Temp 98.1 F (36.7 C) (Oral)   Resp 15   Ht 5' 5 (1.651 m)   Wt 199 lb 15.3 oz (90.7 kg)   SpO2 98%   BMI 33.27 kg/m    Physical Exam Vitals and nursing note reviewed.  Constitutional:      General: He is not in acute distress.    Appearance: Normal appearance. He is well-developed. He is not ill-appearing.  HENT:     Head: Normocephalic and atraumatic.     Nose: Congestion present.     Mouth/Throat:     Mouth: Mucous membranes are moist.     Pharynx: Oropharynx is clear.  Eyes:     General: No scleral icterus.    Conjunctiva/sclera: Conjunctivae normal.  Cardiovascular:     Rate and Rhythm: Normal rate and regular rhythm.  Pulmonary:  Effort: Pulmonary effort is normal. No respiratory distress.     Breath sounds:  Rhonchi (few scattered rhonchi of bilateral upper lung fields) present.  Musculoskeletal:     Cervical back: Neck supple.  Skin:    General: Skin is warm and dry.     Capillary Refill: Capillary refill takes less than 2 seconds.  Neurological:     General: No focal deficit present.     Mental Status: He is alert. Mental status is at baseline.     Motor: No weakness.     Gait: Gait normal.  Psychiatric:        Mood and Affect: Mood normal.        Behavior: Behavior normal.      UC Treatments / Results  Labs (all labs ordered are listed, but only abnormal results are displayed) Labs Reviewed - No data to display  EKG   Radiology No results found.  Procedures Procedures (including critical care time)  Medications Ordered in UC Medications - No data to display  Initial Impression / Assessment and Plan / UC Course  I have reviewed the triage vital signs and the nursing notes.  Pertinent labs & imaging results that were available during my care of the patient were reviewed by me and considered in my medical decision making (see chart for details).   73 year old male with history of asthma and stage II COPD presents for cough and congestion x 10 days.  Cough recently became productive and increased shortness of breath from baseline. No fever.   Patient currently afebrile.  No acute distress.  On exam has nasal congestion and scattered with rhonchi bilateral upper airway/lung fields.  Chest x-ray performed.  No acute abnormality on wet read.  COPD exacerbation.  Will treat at this time with prednisone , Promethazine  DM at bedtime and doxycycline .  Patient has DuoNeb solution and nebulizer at home.  Advised him to use this while sick.  Encouraged increasing rest and fluids.  Advised returning if fever or worsening breathing.  Advise going to ER for acute worsening symptoms.  Chest x-ray over read is negative.  No change to treatment plan.   Final Clinical Impressions(s) / UC  Diagnoses   Final diagnoses:  Acute bronchitis, unspecified organism  COPD exacerbation (HCC)  Acute cough     Discharge Instructions      -You have bronchitis. This often can last for weeks. -I will call if radiologist sees any abnormality like pneumonia, but I did not.  -Start doxycycline  antibiotics.  -Start prednisone  corticosteroid -Use ipratropium/albuterol nebulizer a couple times a day as long as you are ill.  -Increase rest and fluids -Take Mucinex during the day and Promethazine  DM at night as needed for cough. -Return if fever or worsening symptoms.  -Go to ER for worsening shortness of breath or increased chest pain or weakness.     ED Prescriptions     Medication Sig Dispense Auth. Provider   predniSONE  (DELTASONE ) 20 MG tablet Take 2 tablets (40 mg total) by mouth daily for 5 days. 10 tablet Arvis Huxley B, PA-C   promethazine -dextromethorphan (PROMETHAZINE -DM) 6.25-15 MG/5ML syrup Take 5 mLs by mouth at bedtime as needed for cough. 118 mL Arvis Huxley B, PA-C   doxycycline  (VIBRAMYCIN ) 100 MG capsule Take 1 capsule (100 mg total) by mouth 2 (two) times daily for 7 days. 14 capsule Arvis Huxley NOVAK, PA-C      PDMP not reviewed this encounter.   Arvis Huxley NOVAK, PA-C 05/31/24 1040

## 2024-06-02 ENCOUNTER — Ambulatory Visit (HOSPITAL_COMMUNITY): Payer: Self-pay
